# Patient Record
Sex: Male | Born: 1975 | Race: Black or African American | Hispanic: No | Smoking: Heavy tobacco smoker
Health system: Southern US, Community
[De-identification: ages and names within clinical notes are randomized; demographics above are authoritative.]

## PROBLEM LIST (undated history)

## (undated) ENCOUNTER — Emergency Department (HOSPITAL_COMMUNITY): Discharge status: Against Medical Advice | Payer: Self-pay

## (undated) DIAGNOSIS — I10 Essential (primary) hypertension: Secondary | ICD-10-CM

## (undated) DIAGNOSIS — F4310 Post-traumatic stress disorder, unspecified: Secondary | ICD-10-CM

## (undated) DIAGNOSIS — K859 Acute pancreatitis without necrosis or infection, unspecified: Secondary | ICD-10-CM

## (undated) DIAGNOSIS — F202 Catatonic schizophrenia: Secondary | ICD-10-CM

---

## 2006-10-12 HISTORY — PX: OTHER SURGICAL HISTORY: SHX169

## 2007-02-20 ENCOUNTER — Emergency Department (HOSPITAL_COMMUNITY): Admission: EM | Admit: 2007-02-20 | Discharge: 2007-02-20 | Payer: Self-pay | Admitting: Emergency Medicine

## 2010-08-01 ENCOUNTER — Emergency Department (HOSPITAL_BASED_OUTPATIENT_CLINIC_OR_DEPARTMENT_OTHER): Admission: EM | Admit: 2010-08-01 | Discharge: 2010-08-01 | Payer: Self-pay | Admitting: Emergency Medicine

## 2012-08-29 ENCOUNTER — Encounter (HOSPITAL_COMMUNITY): Payer: Self-pay | Admitting: *Deleted

## 2012-08-29 ENCOUNTER — Emergency Department (HOSPITAL_COMMUNITY)
Admission: EM | Admit: 2012-08-29 | Discharge: 2012-09-01 | Disposition: A | Discharge status: Home / Self Care | Payer: Self-pay | Attending: Emergency Medicine | Admitting: Emergency Medicine

## 2012-08-29 ENCOUNTER — Telehealth (HOSPITAL_COMMUNITY): Payer: Self-pay | Admitting: Licensed Clinical Social Worker

## 2012-08-29 DIAGNOSIS — F202 Catatonic schizophrenia: Secondary | ICD-10-CM

## 2012-08-29 DIAGNOSIS — F29 Unspecified psychosis not due to a substance or known physiological condition: Secondary | ICD-10-CM | POA: Insufficient documentation

## 2012-08-29 LAB — ETHANOL: Alcohol, Ethyl (B): <11 mg/dL (ref 0–11)

## 2012-08-29 LAB — POCT I-STAT, CHEM 8
BUN: 21 mg/dL (ref 6–23)
Chloride: 104 mEq/L (ref 96–112)
HCT: 37 % — ABNORMAL LOW (ref 39.0–52.0)
Sodium: 141 mEq/L (ref 135–145)
TCO2: 21 mmol/L (ref 0–100)

## 2012-08-29 MED ORDER — HALOPERIDOL LACTATE 5 MG/ML IJ SOLN
5.0000 mg | Freq: Once | INTRAMUSCULAR | Status: AC
  Administered 2012-08-29: 5 mg via INTRAMUSCULAR
  Filled 2012-08-29: qty 1

## 2012-08-29 MED ORDER — LORAZEPAM 1 MG PO TABS
1.0000 mg | ORAL_TABLET | Freq: Three times a day (TID) | ORAL | Status: DC | PRN
  Filled 2012-08-29 (×2): qty 1

## 2012-08-29 MED ORDER — ZOLPIDEM TARTRATE 5 MG PO TABS: 5.0000 mg | ORAL_TABLET | Freq: Every evening | ORAL | Status: DC | PRN

## 2012-08-29 MED ORDER — ONDANSETRON HCL 4 MG PO TABS: 4.0000 mg | ORAL_TABLET | Freq: Three times a day (TID) | ORAL | Status: DC | PRN

## 2012-08-29 MED ORDER — ALUM & MAG HYDROXIDE-SIMETH 200-200-20 MG/5ML PO SUSP: 30.0000 mL | ORAL | Status: DC | PRN

## 2012-08-29 MED ORDER — MIDAZOLAM HCL 2 MG/2ML IJ SOLN
5.0000 mg | Freq: Once | INTRAMUSCULAR | Status: AC
  Administered 2012-08-29: 5 mg via INTRAVENOUS
  Filled 2012-08-29: qty 6

## 2012-08-29 MED ORDER — ACETAMINOPHEN 325 MG PO TABS: 650.0000 mg | ORAL_TABLET | ORAL | Status: DC | PRN

## 2012-08-29 MED ORDER — MIDAZOLAM HCL 2 MG/2ML IJ SOLN
5.0000 mg | Freq: Once | INTRAMUSCULAR | Status: AC
  Administered 2012-08-29: 5 mg via INTRAMUSCULAR
  Filled 2012-08-29: qty 2

## 2012-08-29 NOTE — Progress Notes (Signed)
ED staff reporting pt not answering questions since ED admission ED note reports pt not taking medications No medications on MAR listed Discussed this with ACT team member who was also unable to get pt to respond to her for assessment of needs CM called and left a voice message for [place] Significant other [phone] Cm requested a return call to to ED TCU to assist with medical, social and medication hx for pt
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
## 2012-08-29 NOTE — Progress Notes (Deleted)
Regular diet per report; pt unable to provide further insight or information regarding his diet.
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
## 2012-08-29 NOTE — ED Notes (Addendum)
Patient is resting comfortably. Breathing WNL.  
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
## 2012-08-29 NOTE — ED Notes (Signed)
Patient refused blood draw for labs.RN Toniann Fail made aware

## 2012-08-29 NOTE — ED Notes (Signed)
Pt ripped off armband and quickly put in his mouth and began to chew it. He is trying to rip off his pants as well. He spit out the armband. [employer] assisted  MD notified.

## 2012-08-29 NOTE — ED Notes (Signed)
Triage started, but patient stopped answering questions when started surgical history. Will attempt to complete later.

## 2012-08-29 NOTE — ED Notes (Signed)
Pt refused labs. M.D. Notified.

## 2012-08-29 NOTE — ED Notes (Signed)
Pt noted to be having seizure like activity by Psych ED RN. Radford Pax, EDP made aware. Pt noted by this RN to be having full body tremors, continuing to through IV insertion and move to TCU, greater than . Pt responsive with eye movement and grimacing to oxygen Waldron placement and movement away from ammonia capsule. Pt has spontaneous eye opening, and pulled away from RN while starting IV. Pt moved to TCU, with IV intact.

## 2012-08-29 NOTE — Progress Notes (Signed)
Records in pt file reports pt listed as recent release from jail on 08/27/12. ED CM contact the following phamacies in [facility]-- [facility] at [phone], [facility] at [phone], [facility] at [phone] and [facility] at [phone] None of these pharmacies have pt listed as active patient receiving medications from them.  ED SW updated

## 2012-08-29 NOTE — ED Notes (Signed)
Patient is resting comfortably. 

## 2012-08-30 DIAGNOSIS — F1010 Alcohol abuse, uncomplicated: Secondary | ICD-10-CM

## 2012-08-30 DIAGNOSIS — G259 Extrapyramidal and movement disorder, unspecified: Secondary | ICD-10-CM

## 2012-08-30 DIAGNOSIS — F4310 Post-traumatic stress disorder, unspecified: Secondary | ICD-10-CM

## 2012-08-30 DIAGNOSIS — F259 Schizoaffective disorder, unspecified: Secondary | ICD-10-CM

## 2012-08-30 LAB — CBC WITH DIFFERENTIAL/PLATELET
Basophils Absolute: 0 10*3/uL (ref 0.0–0.1)
Basophils Relative: 0 % (ref 0–1)
Eosinophils Absolute: 0.1 10*3/uL (ref 0.0–0.7)
Eosinophils Relative: 1 % (ref 0–5)
HCT: 38 % — ABNORMAL LOW (ref 39.0–52.0)
Hemoglobin: 12.9 g/dL — ABNORMAL LOW (ref 13.0–17.0)
MCH: 27.6 pg (ref 26.0–34.0)
MCHC: 33.9 g/dL (ref 30.0–36.0)
Monocytes Absolute: 1.3 10*3/uL — ABNORMAL HIGH (ref 0.1–1.0)
Monocytes Relative: 15 % — ABNORMAL HIGH (ref 3–12)
Neutro Abs: 5.2 10*3/uL (ref 1.7–7.7)
RDW: 13.2 % (ref 11.5–15.5)

## 2012-08-30 LAB — COMPREHENSIVE METABOLIC PANEL
ALT: 31 U/L (ref 0–53)
AST: 63 U/L — ABNORMAL HIGH (ref 0–37)
Alkaline Phosphatase: 80 U/L (ref 39–117)
CO2: 21 mEq/L (ref 19–32)
Calcium: 9.1 mg/dL (ref 8.4–10.5)
GFR calc Af Amer: >90 mL/min (ref >90)
Glucose, Bld: 65 mg/dL — ABNORMAL LOW (ref 70–99)
Potassium: 3.9 mEq/L (ref 3.5–5.1)
Sodium: 139 mEq/L (ref 135–145)
Total Protein: 7.2 g/dL (ref 6.0–8.3)

## 2012-08-30 LAB — SALICYLATE LEVEL: Salicylate Lvl: <2 mg/dL — ABNORMAL LOW (ref 2.8–20.0)

## 2012-08-30 LAB — URINALYSIS, ROUTINE W REFLEX MICROSCOPIC
Ketones, ur: >80 mg/dL — AB
Leukocytes, UA: NEGATIVE
Nitrite: NEGATIVE
Protein, ur: NEGATIVE mg/dL

## 2012-08-30 LAB — RAPID URINE DRUG SCREEN, HOSP PERFORMED: Barbiturates: NOT DETECTED

## 2012-08-30 MED ORDER — BENZTROPINE MESYLATE 1 MG/ML IJ SOLN: 0.5000 mg | Freq: Two times a day (BID) | INTRAMUSCULAR | Status: DC

## 2012-08-30 MED ORDER — DIPHENHYDRAMINE HCL 25 MG PO CAPS
50.0000 mg | ORAL_CAPSULE | Freq: Every day | ORAL | Status: DC
  Administered 2012-08-31: 50 mg via ORAL
  Filled 2012-08-30: qty 2

## 2012-08-30 MED ORDER — HALOPERIDOL DECANOATE 100 MG/ML IM SOLN
50.0000 mg | Freq: Once | INTRAMUSCULAR | Status: AC
  Administered 2012-08-30: 50 mg via INTRAMUSCULAR
  Filled 2012-08-30: qty 0.5

## 2012-08-30 MED ORDER — BENZTROPINE MESYLATE 1 MG/ML IJ SOLN
0.5000 mg | Freq: Two times a day (BID) | INTRAMUSCULAR | Status: DC
  Administered 2012-08-30 – 2012-08-31 (×2): 0.5 mg via INTRAMUSCULAR
  Filled 2012-08-30 (×2): qty 2

## 2012-08-30 MED ORDER — AMMONIA AROMATIC IN INHA
RESPIRATORY_TRACT | Status: AC
  Administered 2012-08-30: 1 mL via NASAL
  Filled 2012-08-30: qty 10

## 2012-08-30 MED ORDER — LORAZEPAM 0.5 MG PO TABS
0.5000 mg | ORAL_TABLET | Freq: Two times a day (BID) | ORAL | Status: DC
  Administered 2012-08-31 – 2012-09-01 (×2): 0.5 mg via ORAL
  Filled 2012-08-30: qty 1

## 2012-08-30 MED ORDER — DIPHENHYDRAMINE HCL 50 MG/ML IJ SOLN
50.0000 mg | Freq: Every day | INTRAMUSCULAR | Status: DC
  Administered 2012-08-30: 50 mg via INTRAMUSCULAR
  Filled 2012-08-30: qty 1

## 2012-08-30 MED ORDER — LORAZEPAM 2 MG/ML IJ SOLN
0.5000 mg | Freq: Two times a day (BID) | INTRAMUSCULAR | Status: DC
  Administered 2012-08-30 – 2012-08-31 (×2): 0.5 mg via INTRAMUSCULAR
  Filled 2012-08-30 (×2): qty 1

## 2012-08-30 MED ORDER — BENZTROPINE MESYLATE 1 MG PO TABS: 0.5000 mg | ORAL_TABLET | Freq: Two times a day (BID) | ORAL | Status: DC

## 2012-08-30 MED ORDER — BENZTROPINE MESYLATE 1 MG PO TABS
0.5000 mg | ORAL_TABLET | Freq: Two times a day (BID) | ORAL | Status: DC
  Administered 2012-08-31 – 2012-09-01 (×2): 0.5 mg via ORAL
  Filled 2012-08-30 (×3): qty 1

## 2012-08-30 NOTE — ED Notes (Signed)
Pt with seizure like activity, post lab collect. Sheets where soiled from an episode of incontinence. Pt is responsive. Has had several tremor like activity duration of 30 -45 seconds , intermittant since 0030.  Pt follows commands, able to change linen and blue scrubs on bed.   MD aware, will assess.

## 2012-08-30 NOTE — Treatment Plan (Signed)
After collaborating with a [facility] medical service provider it was determined that pt meets exclusion criteria based on "prior and or current history of violent behavior which is beyond the capabilities of the staff and physical environment to contain and manage."

## 2012-08-30 NOTE — ED Notes (Signed)
Pt told doctor that if he got different socks it would fix his back. Pt given tan socks and now pt walking to bathroom.
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
## 2012-08-30 NOTE — ED Provider Notes (Signed)
Called to bedside because pt was shaking again, not responding to nurses.  Vitals were taken again and they are unremarkable. Earlier in the day he had been up and communicative.  He spoke to Dr Shela Commons per his note.  Pt has a persistent fine tremor throughout all extremities.  He does not respond to voice.  When I attempt to open his eyes he resists and keeps them shut.  Pt resists movement of his hands and when I hold his hand above his face he keeps puts them back in his lap.  I placed an ammonia capsule under his nose.  Pt held his breath.  When I told him he could get rid of it he then snorted and blew the ammonia capsule away.  This does not appear to be epileptic seizures and appear to be psychiatric in nature.  Pt will be monitored.  Celene Kras, MD 08/30/12 2004

## 2012-08-30 NOTE — ED Notes (Signed)
Patient is resting comfortably. Given warm blankets d/t mild tremors. No incontinence episode noted. Pt follows commands. Refused oral temperature.  Grabbed at the older blankets as the new blankets were being applied.

## 2012-08-30 NOTE — ED Notes (Signed)
Patient is resting comfortably. 

## 2012-08-30 NOTE — ED Provider Notes (Addendum)
Pt seen and evaluated this am.  Awaiting placement.  Denies any issues.  Pt was seen yesterday and last night with shaking.  This was not felt by the physicians to be a seizure and pt stopped with an ammonia capsule the first episode and the second time, he stopped when the physician asked him to stop shaking.   Rolan Bucco, MD 08/30/12 2956  Rolan Bucco, MD 08/30/12 2130  Rolan Bucco, MD 08/30/12 1606

## 2012-08-30 NOTE — Clinical Social Work Note (Signed)
CSW attempted to assess pt at bedside.  Pt requested new scrubs.  Tech made aware.  Pt refused to participate in assessment.  Pt stated, "I need to rest".  CSW proceeded with assessment- pt endorsed selective mutism. Vickii Penna, LCSWA [phone]  Clinical Social Work

## 2012-08-30 NOTE — ED Notes (Signed)
Patient noted to have seizure like activity and Dr. Lynelle Doctor informed and at bedside. Ammonia Capsule use patient responded to that. Will continue to watch patient.

## 2012-08-30 NOTE — ED Notes (Signed)
Pt was offered the choice of PO meds or IM. When asked if pt would swallow pills pt shook head no.

## 2012-08-31 DIAGNOSIS — Z91199 Patient's noncompliance with other medical treatment and regimen due to unspecified reason: Secondary | ICD-10-CM

## 2012-08-31 DIAGNOSIS — Z9119 Patient's noncompliance with other medical treatment and regimen: Secondary | ICD-10-CM

## 2012-08-31 NOTE — Progress Notes (Signed)
[facility] ED CM reviewed EPIC notes since 08/29/12 and noted temp 99.1 oral on 08/31/12 other Vital signs stable. Drug screen positive for benzoiazepines UA spec gravity 1.034 bil moderate >80 ketones blood glucose 65 AST 63 hgb 12.9 hct 38 (but with increase since 08/29/12 was hgb 12.6 hct 37) mono pct 15 mono abs 1.3.  Started on "Cogentin 1 mg PO/IM twice a day, Ativan 0.5mg  PO/IM BID and Benadryl 50 mg Qhs. Gave Haldol Dec 50 mg /0.5 ml and may use Ativan 1 mg PO every six hours / PRN" . Pending medical records from [facility]. Inpatient treatment for further crisis stabilization requested but denied. Re evaluated by ED psychiatrist d/c plans: Continue his current medication management (encouraged po intake of medicines) and "may go home if shows, continuous improvement in his mental status. Patient may need to be hospitalized, who he feels regrets and meanwhile."

## 2012-08-31 NOTE — ED Notes (Signed)
Per Dr. Shela Commons, pt needs to be compliant with request of taking medications PO, eating, and being active in order to be discharged.

## 2012-08-31 NOTE — Progress Notes (Signed)
[facility] ED CM reviewed EPIC notes since
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
## 2012-08-31 NOTE — BHH Counselor (Signed)
Confirmed with [facility] Assessment pt has been declined due to prior and/or current history of violent behavior which is beyond the capabilities of the staff and physical environment to contain and manage. Will seek alternative placement.

## 2012-08-31 NOTE — Consult Note (Signed)
Reason for Consult: psychosis, aggression, non compliance and catatonia Referring Physician: Dr. Rosalio Loud [name] is an 36 y.o. male.  HPI: patient was seen for the follow up psychiatric evaluation. Patient was appeared lying down in his bed supine position Awake, Alert, open his eyes and responded verbally. Patient was calm cooperative, and denied symptoms of depression, anxiety, and psychosis. Patient denied suicidal, homicidal ideation. Patient asked if he can go home. Patient was encouraged to stay out of the bed, use the restroom, take a shower, eat his food and drink fluids. Patient also encouraged rested take his medication by mouth instead of injectable. Patient was taken his Haldol decanoate 50 mg shot yesterday and also received Cogentin and Ativan IM this morning. Patient has been in agreement with recommendations and cooperative with it. If patient continued to improve mental status, might be safe to go home in a day or 2 days. Patient does not have any seizure activity.  MSE: Patient was awake, alert, oriented, has less catatonic symptoms, more verbal. Patient stated, gives a short answers does not elaborate and quickly says don't know, or know I do not remember. It is difficult to find out, his baseline from this. Patient has no irritable, agitated, and aggressive. Patient does not required any restraints. Patient does not appear to be responding to the internal stimuli. Patient has a poor insight, judgment and impulse control is.  History reviewed. No pertinent past medical history.  No past surgical history on file.  No family history on file.  Social History:  does not have a smoking history on file. He does not have any smokeless tobacco history on file. His alcohol and drug histories not on file.  Allergies: No Known Allergies  Medications: I have reviewed the patient's current medications.  Results for orders placed during the hospital encounter of 08/29/12 (from the past 48  hour(s))  COMPREHENSIVE METABOLIC PANEL     Status: Abnormal   Collection Time   08/30/12 12:25 AM      Component Value Range Comment   Sodium 139  135 - 145 mEq/L    Potassium 3.9  3.5 - 5.1 mEq/L    Chloride 98  96 - 112 mEq/L    CO2 21  19 - 32 mEq/L    Glucose, Bld 65 (*) 70 - 99 mg/dL    BUN 18  6 - 23 mg/dL    Creatinine, Ser 1.61  0.50 - 1.35 mg/dL    Calcium 9.1  8.4 - 09.6 mg/dL    Total Protein 7.2  6.0 - 8.3 g/dL    Albumin 3.9  3.5 - 5.2 g/dL    AST 63 (*) 0 - 37 U/L    ALT 31  0 - 53 U/L    Alkaline Phosphatase 80  39 - 117 U/L    Total Bilirubin 0.3  0.3 - 1.2 mg/dL    GFR calc non Af Amer >90  >90 mL/min    GFR calc Af Amer >90  >90 mL/min   CBC WITH DIFFERENTIAL     Status: Abnormal   Collection Time   08/30/12 12:25 AM      Component Value Range Comment   WBC 8.6  4.0 - 10.5 K/uL    RBC 4.67  4.22 - 5.81 MIL/uL    Hemoglobin 12.9 (*) 13.0 - 17.0 g/dL    HCT 04.5 (*) 40.9 - 52.0 %    MCV 81.4  78.0 - 100.0 fL    MCH 27.6  26.0 - 34.0 pg    MCHC 33.9  30.0 - 36.0 g/dL    RDW 16.1  09.6 - 04.5 %    Platelets 150  150 - 400 K/uL    Neutrophils Relative 61  43 - 77 %    Neutro Abs 5.2  1.7 - 7.7 K/uL    Lymphocytes Relative 23  12 - 46 %    Lymphs Abs 2.0  0.7 - 4.0 K/uL    Monocytes Relative 15 (*) 3 - 12 %    Monocytes Absolute 1.3 (*) 0.1 - 1.0 K/uL    Eosinophils Relative 1  0 - 5 %    Eosinophils Absolute 0.1  0.0 - 0.7 K/uL    Basophils Relative 0  0 - 1 %    Basophils Absolute 0.0  0.0 - 0.1 K/uL   AMMONIA     Status: Normal   Collection Time   08/30/12 12:25 AM      Component Value Range Comment   Ammonia 25  11 - 60 umol/L   ACETAMINOPHEN LEVEL     Status: Normal   Collection Time   08/30/12 12:25 AM      Component Value Range Comment   Acetaminophen (Tylenol), Serum <15.0  10 - 30 ug/mL   SALICYLATE LEVEL     Status: Abnormal   Collection Time   08/30/12 12:25 AM      Component Value Range Comment   Salicylate Lvl <2.0 (*) 2.8 - 20.0  mg/dL   URINALYSIS, ROUTINE W REFLEX MICROSCOPIC     Status: Abnormal   Collection Time   08/30/12  7:46 AM      Component Value Range Comment   Color, Urine YELLOW  YELLOW    APPearance CLEAR  CLEAR    Specific Gravity, Urine 1.034 (*) 1.005 - 1.030    pH 6.0  5.0 - 8.0    Glucose, UA NEGATIVE  NEGATIVE mg/dL    Hgb urine dipstick NEGATIVE  NEGATIVE    Bilirubin Urine MODERATE (*) NEGATIVE    Ketones, ur >80 (*) NEGATIVE mg/dL    Protein, ur NEGATIVE  NEGATIVE mg/dL    Urobilinogen, UA 1.0  0.0 - 1.0 mg/dL    Nitrite NEGATIVE  NEGATIVE    Leukocytes, UA NEGATIVE  NEGATIVE MICROSCOPIC NOT DONE ON URINES WITH NEGATIVE PROTEIN, BLOOD, LEUKOCYTES, NITRITE, OR GLUCOSE <1000 mg/dL.  URINE RAPID DRUG SCREEN (HOSP PERFORMED)     Status: Abnormal   Collection Time   08/30/12  7:46 AM      Component Value Range Comment   Opiates NONE DETECTED  NONE DETECTED    Cocaine NONE DETECTED  NONE DETECTED    Benzodiazepines POSITIVE (*) NONE DETECTED    Amphetamines NONE DETECTED  NONE DETECTED    Tetrahydrocannabinol NONE DETECTED  NONE DETECTED    Barbiturates NONE DETECTED  NONE DETECTED     No results found.  Positive for aggressive behavior, bipolar and Psychosis, auditory hallucinations, not specified Blood pressure 116/78, pulse 82, temperature 99.1 F (37.3 C), temperature source Oral, resp. rate 19, SpO2 98.00%.   Assessment/Plan: Schizoaffective disorder Posttraumatic stress disorder  Noncompliance with medication   Patient will continue his current medication management and may go home if shows, continuous improvement in his mental status. Patient may need to be hospitalized, who he feels regrets and meanwhile. Please encourage medication by mouth.  Richard [name],Richard R. 08/31/2012, 5:51 PM

## 2012-08-31 NOTE — BHH Counselor (Signed)
Info sent to [facility] and [facility] for possible placement

## 2012-09-01 ENCOUNTER — Telehealth (HOSPITAL_COMMUNITY): Payer: Self-pay | Admitting: Licensed Clinical Social Worker

## 2012-09-01 DIAGNOSIS — F202 Catatonic schizophrenia: Secondary | ICD-10-CM

## 2012-09-01 MED ORDER — BENZTROPINE MESYLATE 0.5 MG PO TABS: 0.5000 mg | ORAL_TABLET | Freq: Two times a day (BID) | ORAL | Status: AC

## 2012-09-01 MED ORDER — DIPHENHYDRAMINE HCL 50 MG PO CAPS: 50.0000 mg | ORAL_CAPSULE | Freq: Every day | ORAL | Status: AC

## 2012-09-01 NOTE — ED Provider Notes (Signed)
PT is on wait list at [facility] previously by Dr.J and recommends inpt.  Currently sleeping.    Filed Vitals:   09/01/12 0617  BP: 99/61  Pulse: 69  Temp: 98.3 F (36.8 C)  Resp: 18     Richard [name]. Oletta Lamas, MD 09/01/12 [phone]

## 2012-09-01 NOTE — ED Notes (Signed)
Dr. Jonalagada at bedside.  
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
## 2012-09-01 NOTE — ED Notes (Signed)
Dr. Elsie Saas and Jessie Foot with act team aware of IVC papers, per [facility], will take care of it.

## 2012-09-01 NOTE — Consult Note (Signed)
Reason for Consult: Schizophrenic Catatonia Referring Physician: Dr. Veverly Fells Richard [name] is an 36 y.o. male.  HPI: Patient was seen and chart reviewed. Patient has been improved his mental status during last 24 hours with his current treatment and participated in his ADL's and able to eat his food without difficulty and drink his fluids. He came out side to nursing station and requested regarding his needs and able to take shower without assistance. He does not required restraints, additional IM medications and compliant with prescribed medication which swallowed by mouth. He denied symptoms of depression, anxiety, paranoia, delusions and hallucinations.  MSE: patient was calm and cooperative. He was found in his room and shower active, energetic, able to perform all basic activities. He has normal psychomotor activity. He has no signs and symptoms of catatonia at this time. He has good mood and bright and full affect. He has normal speech and thought process. He has denied SI/HI and no evidence of psychosis.  History reviewed. No pertinent past medical history.  No past surgical history on file.  No family history on file.  Social History:  does not have a smoking history on file. He does not have any smokeless tobacco history on file. His alcohol and drug histories not on file.  Allergies: No Known Allergies  Medications: I have reviewed the patient's current medications.  No results found for this or any previous visit (from the past 48 hour(s)).  No results found.  Positive for bipolar, depression, sleep disturbance and catatonia and aggression prior to admission. Blood pressure 109/71, pulse 72, temperature 98.3 F (36.8 C), temperature source Oral, resp. rate 18, SpO2 99.00%.   Assessment/Plan: Paranoid schizophrenia with Catatonia  Patient has significant improvement with his treatment, not required acute psychiatric hospitalization and referred to out patient psychiatric  services at [facility] behavioral health in [facility]. He was discharged to home with five days supply of scripts for Benadryl and cogentin. ACT therapist will contact his GF and to inform about his improvement and discharge. Patient is hoping his GF will come to pick him up.  Richard [name],Richard R. 09/01/2012, 3:32 PM

## 2012-09-01 NOTE — ED Notes (Signed)
Pt to be discharged home per Dr. Elsie Saas after Jessie Foot with act team speaks with girlfriend and finishes paperwork, Jessie Foot requested that this nurse await until her portion is finished before discharging pt home, will monitor.

## 2014-12-25 ENCOUNTER — Inpatient Hospital Stay (HOSPITAL_COMMUNITY)
Admission: EM | Admit: 2014-12-25 | Discharge: 2014-12-28 | DRG: 439 | Disposition: A | Discharge status: Home / Self Care | Payer: Self-pay | Attending: Family Medicine | Admitting: Family Medicine

## 2014-12-25 ENCOUNTER — Encounter (HOSPITAL_COMMUNITY): Payer: Self-pay | Admitting: Emergency Medicine

## 2014-12-25 DIAGNOSIS — F202 Catatonic schizophrenia: Secondary | ICD-10-CM | POA: Diagnosis present

## 2014-12-25 DIAGNOSIS — F17213 Nicotine dependence, cigarettes, with withdrawal: Secondary | ICD-10-CM

## 2014-12-25 DIAGNOSIS — K859 Acute pancreatitis without necrosis or infection, unspecified: Secondary | ICD-10-CM | POA: Diagnosis present

## 2014-12-25 DIAGNOSIS — B1910 Unspecified viral hepatitis B without hepatic coma: Secondary | ICD-10-CM

## 2014-12-25 DIAGNOSIS — D696 Thrombocytopenia, unspecified: Secondary | ICD-10-CM | POA: Diagnosis present

## 2014-12-25 DIAGNOSIS — I10 Essential (primary) hypertension: Secondary | ICD-10-CM

## 2014-12-25 DIAGNOSIS — K858 Other acute pancreatitis: Secondary | ICD-10-CM

## 2014-12-25 HISTORY — DX: Catatonic schizophrenia: F20.2

## 2014-12-25 HISTORY — DX: Acute pancreatitis without necrosis or infection, unspecified: K85.90

## 2014-12-25 HISTORY — DX: Post-traumatic stress disorder, unspecified: F43.10

## 2014-12-25 HISTORY — DX: Essential (primary) hypertension: I10

## 2014-12-25 LAB — URINALYSIS, ROUTINE W REFLEX MICROSCOPIC
GLUCOSE, UA: NEGATIVE mg/dL
KETONES UR: 15 mg/dL — AB
LEUKOCYTES UA: NEGATIVE
NITRITE: NEGATIVE
Specific Gravity, Urine: 1.042 — ABNORMAL HIGH (ref 1.005–1.030)
UROBILINOGEN UA: 0.2 mg/dL (ref 0.0–1.0)
pH: 5.5 (ref 5.0–8.0)

## 2014-12-25 LAB — CBC WITH DIFFERENTIAL/PLATELET
BASOS PCT: 0 % (ref 0–1)
Basophils Absolute: 0 10*3/uL (ref 0.0–0.1)
Eosinophils Absolute: 0 10*3/uL (ref 0.0–0.7)
Eosinophils Relative: 0 % (ref 0–5)
HEMATOCRIT: 48.4 % (ref 39.0–52.0)
HEMOGLOBIN: 17.1 g/dL — AB (ref 13.0–17.0)
Lymphocytes Relative: 11 % — ABNORMAL LOW (ref 12–46)
Lymphs Abs: 1.3 10*3/uL (ref 0.7–4.0)
MCH: 28.8 pg (ref 26.0–34.0)
MCHC: 35.3 g/dL (ref 30.0–36.0)
MCV: 81.6 fL (ref 78.0–100.0)
MONO ABS: 1.3 10*3/uL — AB (ref 0.1–1.0)
MONOS PCT: 11 % (ref 3–12)
NEUTROS ABS: 9.2 10*3/uL — AB (ref 1.7–7.7)
Neutrophils Relative %: 78 % — ABNORMAL HIGH (ref 43–77)
Platelets: 173 10*3/uL (ref 150–400)
RBC: 5.93 MIL/uL — ABNORMAL HIGH (ref 4.22–5.81)
RDW: 12.5 % (ref 11.5–15.5)
WBC: 11.8 10*3/uL — ABNORMAL HIGH (ref 4.0–10.5)

## 2014-12-25 LAB — COMPREHENSIVE METABOLIC PANEL
ALBUMIN: 4.6 g/dL (ref 3.5–5.2)
ALK PHOS: 81 U/L (ref 39–117)
ALT: 33 U/L (ref 0–53)
AST: 32 U/L (ref 0–37)
Anion gap: 10 (ref 5–15)
BUN: 10 mg/dL (ref 6–23)
CALCIUM: 9.5 mg/dL (ref 8.4–10.5)
CO2: 27 mmol/L (ref 19–32)
Chloride: 100 mmol/L (ref 96–112)
Creatinine, Ser: 1.23 mg/dL (ref 0.50–1.35)
GFR calc Af Amer: 85 mL/min — ABNORMAL LOW (ref >90)
GFR calc non Af Amer: 74 mL/min — ABNORMAL LOW (ref >90)
Glucose, Bld: 147 mg/dL — ABNORMAL HIGH (ref 70–99)
POTASSIUM: 4.1 mmol/L (ref 3.5–5.1)
SODIUM: 137 mmol/L (ref 135–145)
TOTAL PROTEIN: 8.8 g/dL — AB (ref 6.0–8.3)
Total Bilirubin: 1 mg/dL (ref 0.3–1.2)

## 2014-12-25 LAB — LIPASE, BLOOD: Lipase: 1446 U/L — ABNORMAL HIGH (ref 11–59)

## 2014-12-25 LAB — URINE MICROSCOPIC-ADD ON

## 2014-12-25 LAB — LACTATE DEHYDROGENASE: LDH: 190 U/L (ref 94–250)

## 2014-12-25 MED ORDER — AMLODIPINE BESYLATE 5 MG PO TABS
5.0000 mg | ORAL_TABLET | Freq: Every day | ORAL | Status: DC
  Administered 2014-12-25 – 2014-12-28 (×4): 5 mg via ORAL
  Filled 2014-12-25 (×4): qty 1

## 2014-12-25 MED ORDER — ENOXAPARIN SODIUM 40 MG/0.4ML ~~LOC~~ SOLN
40.0000 mg | SUBCUTANEOUS | Status: DC
  Administered 2014-12-25 – 2014-12-27 (×3): 40 mg via SUBCUTANEOUS
  Filled 2014-12-25 (×3): qty 0.4

## 2014-12-25 MED ORDER — SODIUM CHLORIDE 0.9 % IV BOLUS (SEPSIS)
1000.0000 mL | Freq: Once | INTRAVENOUS | Status: AC
  Administered 2014-12-25: 1000 mL via INTRAVENOUS

## 2014-12-25 MED ORDER — HYDROMORPHONE HCL 1 MG/ML IJ SOLN
1.0000 mg | Freq: Once | INTRAMUSCULAR | Status: AC
  Administered 2014-12-25: 1 mg via INTRAVENOUS
  Filled 2014-12-25: qty 1

## 2014-12-25 MED ORDER — ONDANSETRON HCL 4 MG PO TABS
4.0000 mg | ORAL_TABLET | Freq: Four times a day (QID) | ORAL | Status: DC | PRN
  Administered 2014-12-27: 4 mg via ORAL
  Filled 2014-12-25: qty 1

## 2014-12-25 MED ORDER — ONDANSETRON HCL 4 MG/2ML IJ SOLN: 4.0000 mg | Freq: Four times a day (QID) | INTRAMUSCULAR | Status: DC | PRN

## 2014-12-25 MED ORDER — SODIUM CHLORIDE 0.9 % IV SOLN
INTRAVENOUS | Status: DC
  Administered 2014-12-25: 12:00:00 via INTRAVENOUS
  Administered 2014-12-25: 1000 mL via INTRAVENOUS
  Administered 2014-12-26: via INTRAVENOUS
  Administered 2014-12-26: 1000 mL via INTRAVENOUS
  Administered 2014-12-26 (×2): via INTRAVENOUS
  Administered 2014-12-27: 150 mL/h via INTRAVENOUS
  Administered 2014-12-27: 14:00:00 via INTRAVENOUS

## 2014-12-25 MED ORDER — HYDROMORPHONE HCL 1 MG/ML IJ SOLN
0.5000 mg | INTRAMUSCULAR | Status: DC | PRN
  Administered 2014-12-25 – 2014-12-27 (×9): 0.5 mg via INTRAVENOUS
  Filled 2014-12-25 (×9): qty 1

## 2014-12-25 MED ORDER — FLEET ENEMA 7-19 GM/118ML RE ENEM: 1.0000 | ENEMA | Freq: Every day | RECTAL | Status: DC | PRN

## 2014-12-25 MED ORDER — ONDANSETRON HCL 4 MG/2ML IJ SOLN
4.0000 mg | Freq: Once | INTRAMUSCULAR | Status: AC
Start: 2014-12-25 — End: 2014-12-25
  Administered 2014-12-25: 4 mg via INTRAVENOUS
  Filled 2014-12-25: qty 2

## 2014-12-25 MED ORDER — SODIUM CHLORIDE 0.9 % IV SOLN
INTRAVENOUS | Status: AC
  Administered 2014-12-25: 09:00:00 via INTRAVENOUS

## 2014-12-25 MED ORDER — NICOTINE 21 MG/24HR TD PT24
21.0000 mg | MEDICATED_PATCH | Freq: Every day | TRANSDERMAL | Status: DC
  Administered 2014-12-25 – 2014-12-28 (×4): 21 mg via TRANSDERMAL
  Filled 2014-12-25 (×4): qty 1

## 2014-12-25 NOTE — ED Notes (Signed)
Bed: WLPT2 Expected date:  Expected time:  Means of arrival:  Comments: EMS 39yo

## 2014-12-25 NOTE — ED Notes (Signed)
Pt states that he has had abdominal pain and N/V x 2 days. Denies diarrhea. Alert and oriented.

## 2014-12-25 NOTE — H&P (Signed)
[facility] History and Physical  Arlys JohnJames M Lesch ZOX:[id number] DOB: 12/09/1975 DOA: 12/25/2014  Referring physician:  Dutch Quinthris Pollina PCP:  No primary care provider on file.   Chief Complaint:  Abdominal pain, nausea, vomiting  HPI:  The patient is a 39 y.o. year-old male with history of pancreatitis, schizophrenia catatonia who presents with abdominal pain, nausea, and vomiting.  The patient was hospitalized at [facility] in December 2015 with acute pancreatitis.  His HCTZ was stopped and he reportedly had imaging of his biliary tree/pancreas, but did not require endoscopy for follow up.  He was discharged home on oxycodone and omeprazole.  He had two alcoholic drinks 5 days prior to admission.  Two days later, he developed nausea, vomiting, and diffuse abdominal pain which has worsened to 10/10 pain.  He tried over the counter liquid "stomach upset" medication which did not help.  He was unable to eat and drink much and felt he was becoming dehydrated.  He states he has not had diarrhea and his last BM was 5 days ago.  He denies fevers, chills, dysuria.  Symptoms are similar to his previous pancreatitis.  In the ER, VS notable for temperature 99.32F, BP mildly elevated.  WBC 11.8, hemoglobin 17.1, glucose 147. LFTs wnl, but lipase 1446.    Review of Systems:  General:  Denies fevers, chills, weight loss or gain HEENT:  Denies changes to hearing and vision, rhinorrhea, sinus congestion, sore throat CV:  Denies chest pain and palpitations, lower extremity edema.  PULM:  Denies SOB, wheezing, cough.   GI:  Per HPI.   GU:  Denies dysuria, frequency, urgency ENDO:  Denies polyuria, polydipsia.   HEME:  Denies hematemesis, blood in stools, melena, abnormal bruising or bleeding.  LYMPH:  Denies lymphadenopathy.   MSK:  Denies arthralgias, myalgias.   DERM:  Denies skin rash or ulcer.   NEURO:  Denies focal numbness, weakness, slurred speech, confusion, facial droop.  PSYCH:   Denies anxiety and depression.    Past Medical History  Diagnosis Date  . Hypertension   . PTSD (post-traumatic stress disorder)   . Pancreatitis   . Catatonia schizophrenia    Past Surgical History  Procedure Laterality Date  . Liver biospy  2008   Social History:  reports that he has been smoking.  He does not have any smokeless tobacco history on file. He reports that he drinks about 1.2 oz of alcohol per week. He reports that he does not use illicit drugs.  No Known Allergies  Family History  Problem Relation Age of Onset  . Pancreatitis Neg Hx      Prior to Admission medications   Medication Sig Start Date End Date Taking? Authorizing Provider  haloperidol decanoate (HALDOL DECANOATE) 50 MG/ML injection Inject 50 mg into the muscle every 28 (twenty-eight) days.   Yes Historical Provider, MD  lisinopril (PRINIVIL,ZESTRIL) 20 MG tablet Take 20 mg by mouth daily.   Yes Historical Provider, MD  benztropine (COGENTIN) 0.5 MG tablet Take 1 tablet (0.5 mg total) by mouth 2 (two) times daily. Patient not taking: Reported on 12/25/2014 09/01/12   Leata MouseJanardhana Jonnalagadda, MD  diphenhydrAMINE (BENADRYL) 50 MG capsule Take 1 capsule (50 mg total) by mouth at bedtime. Patient not taking: Reported on 12/25/2014 09/01/12   Leata MouseJanardhana Jonnalagadda, MD   Physical Exam: Filed Vitals:   12/25/14 0733 12/25/14 0926 12/25/14 0946 12/25/14 0950  BP: 145/89 145/78 142/83   Pulse: 82 78 77   Temp: 99.7 F (37.6  C)  98.7 F (37.1 C)   TempSrc: Oral  Oral   Resp: Height:    6' (1.829 m)  Weight:    76.204 kg (168 lb)  SpO2: 96% 98% 95%      General:  Adult male, NAD  Eyes:  PERRL, anicteric, non-injected.  ENT:  Nares mildly congested.  OP clear, non-erythematous without plaques or exudates.  MMM.  Neck:  Supple without TM or JVD.    Lymph:  No cervical, supraclavicular, or submandibular LAD.  Cardiovascular:  RRR, normal S1, S2, without m/r/g.  2+ pulses, warm  extremities  Respiratory:  CTA bilaterally without increased WOB.  Abdomen:  NABS.  Soft, ND, diffusely TTP, worse in bilateral upper quadrants/epigastrium, and periumbilical area.     Skin:  No rashes or focal lesions.  Musculoskeletal:  Normal bulk and tone.  No LE edema.  Psychiatric:  A & O x 4.  Appropriate affect.  Neurologic:  CN 3-12 intact.  5/5 strength.  Sensation intact.  Labs on Admission:  Basic Metabolic Panel:  Recent Labs Lab 12/25/14 0316  NA 137  K 4.1  CL 100  CO2 27  GLUCOSE 147*  BUN 10  CREATININE 1.23  CALCIUM 9.5   Liver Function Tests:  Recent Labs Lab 12/25/14 0316  AST 32  ALT 33  ALKPHOS 81  BILITOT 1.0  PROT 8.8*  ALBUMIN 4.6    Recent Labs Lab 12/25/14 0316  LIPASE 1446*   No results for input(s): AMMONIA in the last 168 hours. CBC:  Recent Labs Lab 12/25/14 0316  WBC 11.8*  NEUTROABS 9.2*  HGB 17.1*  HCT 48.4  MCV 81.6  PLT 173   Cardiac Enzymes: No results for input(s): CKTOTAL, CKMB, CKMBINDEX, TROPONINI in the last 168 hours.  BNP (last 3 results) No results for input(s): BNP in the last 8760 hours.  ProBNP (last 3 results) No results for input(s): PROBNP in the last 8760 hours.  CBG: No results for input(s): GLUCAP in the last 168 hours.  Radiological Exams on Admission: No results found.  EKG: pending  Assessment/Plan Principal Problem:   Pancreatitis Active Problems:   Schizophrenic catatonia   Essential hypertension   Hepatitis B   Cigarette nicotine dependence with withdrawal  ---  Acute pancreatitis, Ranson score 0-1 (LDH pending).  Does not meet SIRS criteria.   -  Records have been requested from [facility] to see what work up has been completed so far.  Sounds like they may have attributed his pancreatitis to HCTZ, however, despite stopping this medication, he has had a recurrence.  Denies heavy or frequent binge alcohol use -  Alcohol cessation -  D/c lisinopril -  NS bolus x 2  followed by aggressive IVF -  CLD -  Dilaudid IV prn -  zofran prn -  ECG to check QTc  HTN -  Start norvasc  Hepatitis B, stable, followed by GI -  Liver biopsy last done in 2008  Schizophrenic catatonia, stable -  Receives monthly haldol injections -  Confirm QTc  Smoking -  Counseled cessation -  Nicotine patch   Diet:  CLD Access:  PIV IVF:  yes Proph:  lovenox  Code Status: Full Family Communication: patient alone Disposition Plan: Admit to med-surg  Time spent: 60 min Srinivas Lippman [facility] Pager [phone]  If 7PM-7AM, please contact night-coverage [email or web address] Password [facility] 12/25/2014, 10:37 AM

## 2014-12-25 NOTE — Progress Notes (Signed)
Education material provided to patient on Acute Pancreatitis as well as smoking cessation

## 2014-12-25 NOTE — ED Provider Notes (Signed)
CSN: [id number]     Arrival date & time 12/25/14  0216 History   First MD Initiated Contact with Patient 12/25/14 [phone]     Chief Complaint  Patient presents with  . Emesis  . Abdominal Pain     (Consider location/radiation/quality/duration/timing/severity/associated sxs/prior Treatment) HPI Comments: Patient presents to the ER for evaluation of abdominal pain. Patient reports that symptoms began 2 days ago. He reports diffuse abdominal pain with nausea and vomiting. Pain is severe and constant. He has not identified any exacerbating or alleviating factors. Patient reports that he had similar symptoms in December and was admitted to [facility] with pancreatitis. He is not sure what caused the pancreatitis at that time. He denies alcohol use.  Patient is a 39 y.o. male presenting with vomiting and abdominal pain.  Emesis Associated symptoms: abdominal pain   Abdominal Pain Associated symptoms: nausea and vomiting     Past Medical History  Diagnosis Date  . Hypertension   . PTSD (post-traumatic stress disorder)   . Pancreatitis    No past surgical history on file. No family history on file. History  Substance Use Topics  . Smoking status: Not on file  . Smokeless tobacco: Not on file  . Alcohol Use: Not on file    Review of Systems  Gastrointestinal: Positive for nausea, vomiting and abdominal pain.  All other systems reviewed and are negative.     Allergies  Review of patient's allergies indicates no known allergies.  Home Medications   Prior to Admission medications   Medication Sig Start Date End Date Taking? Authorizing Provider  haloperidol decanoate (HALDOL DECANOATE) 50 MG/ML injection Inject 50 mg into the muscle every 28 (twenty-eight) days.   Yes Historical Provider, MD  lisinopril (PRINIVIL,ZESTRIL) 20 MG tablet Take 20 mg by mouth daily.   Yes Historical Provider, MD  benztropine (COGENTIN) 0.5 MG tablet Take 1 tablet (0.5 mg total) by mouth 2  (two) times daily. Patient not taking: Reported on 12/25/2014 09/01/12   Leata Mouse, MD  diphenhydrAMINE (BENADRYL) 50 MG capsule Take 1 capsule (50 mg total) by mouth at bedtime. Patient not taking: Reported on 12/25/2014 09/01/12   Leata Mouse, MD   BP 145/89 mmHg  Pulse 87  Temp(Src) 98.5 F (36.9 C) (Oral)  Resp 18  SpO2 98% Physical Exam  Constitutional: He is oriented to person, place, and time. He appears well-developed and well-nourished. No distress.  HENT:  Head: Normocephalic and atraumatic.  Right Ear: Hearing normal.  Left Ear: Hearing normal.  Nose: Nose normal.  Mouth/Throat: Oropharynx is clear and moist and mucous membranes are normal.  Eyes: Conjunctivae and EOM are normal. Pupils are equal, round, and reactive to light.  Neck: Normal range of motion. Neck supple.  Cardiovascular: Regular rhythm, S1 normal and S2 normal.  Exam reveals no gallop and no friction rub.   No murmur heard. Pulmonary/Chest: Effort normal and breath sounds normal. No respiratory distress. He exhibits no tenderness.  Abdominal: Soft. Normal appearance and bowel sounds are normal. There is no hepatosplenomegaly. There is tenderness. There is no rebound, no guarding, no tenderness at McBurney's point and negative Murphy's sign. No hernia.  Musculoskeletal: Normal range of motion.  Neurological: He is alert and oriented to person, place, and time. He has normal strength. No cranial nerve deficit or sensory deficit. Coordination normal. GCS eye subscore is 4. GCS verbal subscore is 5. GCS motor subscore is 6.  Skin: Skin is warm, dry and intact. No rash noted. No  cyanosis.  Psychiatric: He has a normal mood and affect. His speech is normal and behavior is normal. Thought content normal.  Nursing note and vitals reviewed.   ED Course  Procedures (including critical care time) Labs Review Labs Reviewed  CBC WITH DIFFERENTIAL/PLATELET - Abnormal; Notable for the following:     WBC 11.8 (*)    RBC 5.93 (*)    Hemoglobin 17.1 (*)    Neutrophils Relative % 78 (*)    Neutro Abs 9.2 (*)    Lymphocytes Relative 11 (*)    Monocytes Absolute 1.3 (*)    All other components within normal limits  COMPREHENSIVE METABOLIC PANEL - Abnormal; Notable for the following:    Glucose, Bld 147 (*)    Total Protein 8.8 (*)    GFR calc non Af Amer 74 (*)    GFR calc Af Amer 85 (*)    All other components within normal limits  LIPASE, BLOOD - Abnormal; Notable for the following:    Lipase 1446 (*)    All other components within normal limits  URINALYSIS, ROUTINE W REFLEX MICROSCOPIC    Imaging Review No results found.   EKG Interpretation None      MDM   Final diagnoses:  Acute pancreatitis, unspecified pancreatitis type   Patient presents to the ER for evaluation of abdominal pain, nausea, vomiting. Patient has a history of pancreatitis. Symptoms are similar today. Patient has diffuse abdominal tenderness without signs of peritonitis. Vital signs are stable except for mild hypertension. No fever. Lipase 1446. Patient has had previous pancreatitis, does not require any imaging at this time because his exam is fairly benign. Will obtain records from [facility]. Admit patient for fluids and pain control.     Gilda Creasehristopher J Teckla Christiansen, MD 12/25/14 [phone]

## 2014-12-25 NOTE — Care Management Note (Signed)
    Page 1 of 1   12/25/2014     11:49:55 AM CARE MANAGEMENT NOTE 12/25/2014  Patient:  Arlys JohnMAYEN,Kharee M   Account Number:  [id number]  Date Initiated:  12/25/2014  Documentation initiated by:  Lorenda IshiharaPEELE,Nigel Wessman  Subjective/Objective Assessment:   39 yo male admitted with pancreatitis. PTA lived at home.     Action/Plan:   Home when stable   Anticipated DC Date:  12/28/2014   Anticipated DC Plan:  HOME/SELF CARE      DC Planning Services  CM consult  PCP issues  Medication Assistance      Choice offered to / List presented to:             Status of service:  Completed, signed off Medicare Important Message given?   (If response is "NO", the following Medicare IM given date fields will be blank) Date Medicare IM given:   Medicare IM given by:   Date Additional Medicare IM given:   Additional Medicare IM given by:    Discharge Disposition:  HOME/SELF CARE  Per UR Regulation:  Reviewed for med. necessity/level of care/duration of stay  If discussed at Long Length of Stay Meetings, dates discussed:    Comments:  12-25-14 Lorenda IshiharaSuzanne Sanford Lindblad RN CM 1130 Spoke with patient at bedside. States PCP [place]. [place]o [place]. Has seen this PCP within the last year and wishes to continue to see her. States get medication from here as well. Will continue to follow for d/c needs.

## 2014-12-25 NOTE — Progress Notes (Signed)
Called for patient report from ED.  Nurse to call back with report

## 2014-12-26 DIAGNOSIS — K859 Acute pancreatitis, unspecified: Principal | ICD-10-CM

## 2014-12-26 DIAGNOSIS — B181 Chronic viral hepatitis B without delta-agent: Secondary | ICD-10-CM

## 2014-12-26 LAB — CBC
HCT: 40.6 % (ref 39.0–52.0)
Hemoglobin: 13.6 g/dL (ref 13.0–17.0)
MCH: 27.7 pg (ref 26.0–34.0)
MCHC: 33.5 g/dL (ref 30.0–36.0)
MCV: 82.7 fL (ref 78.0–100.0)
PLATELETS: 108 10*3/uL — AB (ref 150–400)
RBC: 4.91 MIL/uL (ref 4.22–5.81)
RDW: 12.9 % (ref 11.5–15.5)
WBC: 7.5 10*3/uL (ref 4.0–10.5)

## 2014-12-26 LAB — COMPREHENSIVE METABOLIC PANEL
ALBUMIN: 3.1 g/dL — AB (ref 3.5–5.2)
ALK PHOS: 52 U/L (ref 39–117)
ALT: 18 U/L (ref 0–53)
AST: 17 U/L (ref 0–37)
Anion gap: 10 (ref 5–15)
BUN: 6 mg/dL (ref 6–23)
CALCIUM: 8.1 mg/dL — AB (ref 8.4–10.5)
CO2: 26 mmol/L (ref 19–32)
Chloride: 104 mmol/L (ref 96–112)
Creatinine, Ser: 1.09 mg/dL (ref 0.50–1.35)
GFR calc Af Amer: >90 mL/min (ref >90)
GFR calc non Af Amer: 85 mL/min — ABNORMAL LOW (ref >90)
GLUCOSE: 92 mg/dL (ref 70–99)
POTASSIUM: 3.7 mmol/L (ref 3.5–5.1)
SODIUM: 140 mmol/L (ref 135–145)
TOTAL PROTEIN: 6.3 g/dL (ref 6.0–8.3)
Total Bilirubin: 1.3 mg/dL — ABNORMAL HIGH (ref 0.3–1.2)

## 2014-12-26 LAB — LIPASE, BLOOD: LIPASE: 208 U/L — AB (ref 11–59)

## 2014-12-26 NOTE — Progress Notes (Signed)
Richard ChurnJames M [name] [id number] DOB: 12/29/1975 DOA: 12/25/2014 PCP: Jaclyn ShaggyAMAO, ENOBONG, MD  Brief narrative: 39 y/o ? Prior Pancreatitis, Schizophrenia, h/o Hep B  2008, prior heavy EtOH otherwise healthy individual admitted 12/25/58 acute pancreatitis, lipase 1400. States last alcoholic drink was on 12/21/14-not a current heavy drinker per his report  Past medical history-As per Problem list Chart reviewed as below-   Consultants:  None  Procedures:  None  Antibiotics:  None   Subjective  States having mild abdominal pain No fever no chills Tolerating clear liquid diet reasonably Concerned about lack of passage of stool   Objective    Interim History:   Telemetry:    Objective: Filed Vitals:   12/25/14 0950 12/25/14 1400 12/25/14 2125 12/26/14 0538  BP:  134/87 131/82 111/61  Pulse:  81 73 75  Temp:  98.3 F (36.8 C) 99.6 F (37.6 C) 100.4 F (38 C)  TempSrc:  Oral Oral Oral  Resp:  14 16 16   Height: 6' (1.829 m)     Weight: 76.204 kg (168 lb)     SpO2:  99% 95% 95%    Intake/Output Summary (Last 24 hours) at 12/26/14 1535 Last data filed at 12/26/14 1000  Gross per 24 hour  Intake   2565 ml  Output   1550 ml  Net   1015 ml    Exam:  General: EOMI, poor eye contact, slightly anxious Cardiovascular: S1-S2 no murmur rub or gallop Respiratory: Clinically clear no added sound Abdomen: Soft slightly tender epigastrium/subcostal region on the right side Skin no lower extremity edema Neuro intact  Data Reviewed: Basic Metabolic Panel:  Recent Labs Lab 12/25/14 0316 12/26/14 0520  NA 137 140  K 4.1 3.7  CL 100 104  CO2 27 26  GLUCOSE 147* 92  BUN 10 6  CREATININE 1.23 1.09  CALCIUM 9.5 8.1*   Liver Function Tests:  Recent Labs Lab 12/25/14 0316 12/26/14 0520  AST 32 17  ALT 33 18  ALKPHOS 81 52  BILITOT 1.0 1.3*  PROT 8.8* 6.3  ALBUMIN 4.6 3.1*    Recent Labs Lab 12/25/14 0316 12/26/14 0520  LIPASE 1446* 208*   No results for  input(s): AMMONIA in the last 168 hours. CBC:  Recent Labs Lab 12/25/14 0316 12/26/14 0520  WBC 11.8* 7.5  NEUTROABS 9.2*  --   HGB 17.1* 13.6  HCT 48.4 40.6  MCV 81.6 82.7  PLT 173 108*   Cardiac Enzymes: No results for input(s): CKTOTAL, CKMB, CKMBINDEX, TROPONINI in the last 168 hours. BNP: Invalid input(s): POCBNP CBG: No results for input(s): GLUCAP in the last 168 hours.  No results found for this or any previous visit (from the past 240 hour(s)).   Studies:              All Imaging reviewed and is as per above notation   Scheduled Meds: . amLODipine  5 mg Oral Daily  . enoxaparin (LOVENOX) injection  40 mg Subcutaneous Q24H  . nicotine  21 mg Transdermal Daily   Continuous Infusions: . sodium chloride 150 mL/hr at 12/26/14 0921     Assessment/Plan: 1. Acute pancreatitis-occult etiology-await lipid panel. EtOH cessation counseled. Continue IV saline 1 50 cc per hour, graduate to soft diet--notes from [facility] reviewed 2. Catatonic schizophrenia-not currently on by mouth medications-however is on haloperidol decanoate IM every 28 days. This will need to be refilled by his prior physician. Would recommence benztropine 0.5 twice a day on discharge. Would recommence Benadryl  50 daily at bedtime for sleep on discharge 3. History hypertension-lisinopril on hold. Continue amlodipine 5 daily. 4. History PTSD-outpatient psychiatry follow-up needed 5. History hepatitis B-has not received counseling for the same. Consider HIV testing as an outpatient as per primary care physician 6. Tobacco abuse disorder-continue nicotine patch 21 g daily  Code Status: Full Family Communication: None at bedside Disposition Plan: Inpatient   Pleas Koch, MD  [facility] Pager [phone] 12/26/2014, 3:35 PM    LOS: 1 day

## 2014-12-27 ENCOUNTER — Ambulatory Visit: Payer: Self-pay

## 2014-12-27 DIAGNOSIS — K850 Idiopathic acute pancreatitis: Secondary | ICD-10-CM

## 2014-12-27 LAB — COMPREHENSIVE METABOLIC PANEL
ALT: 16 U/L (ref 0–53)
AST: 20 U/L (ref 0–37)
Albumin: 3 g/dL — ABNORMAL LOW (ref 3.5–5.2)
Alkaline Phosphatase: 49 U/L (ref 39–117)
Anion gap: 8 (ref 5–15)
BILIRUBIN TOTAL: 1 mg/dL (ref 0.3–1.2)
BUN: <5 mg/dL — ABNORMAL LOW (ref 6–23)
CO2: 28 mmol/L (ref 19–32)
CREATININE: 1 mg/dL (ref 0.50–1.35)
Calcium: 8.5 mg/dL (ref 8.4–10.5)
Chloride: 104 mmol/L (ref 96–112)
GFR calc non Af Amer: >90 mL/min (ref >90)
Glucose, Bld: 101 mg/dL — ABNORMAL HIGH (ref 70–99)
POTASSIUM: 3.7 mmol/L (ref 3.5–5.1)
Sodium: 140 mmol/L (ref 135–145)
TOTAL PROTEIN: 6.3 g/dL (ref 6.0–8.3)

## 2014-12-27 LAB — CBC
HCT: 39.2 % (ref 39.0–52.0)
HEMOGLOBIN: 13.3 g/dL (ref 13.0–17.0)
MCH: 27.9 pg (ref 26.0–34.0)
MCHC: 33.9 g/dL (ref 30.0–36.0)
MCV: 82.2 fL (ref 78.0–100.0)
Platelets: 104 10*3/uL — ABNORMAL LOW (ref 150–400)
RBC: 4.77 MIL/uL (ref 4.22–5.81)
RDW: 12.5 % (ref 11.5–15.5)
WBC: 5.5 10*3/uL (ref 4.0–10.5)

## 2014-12-27 LAB — LIPASE, BLOOD: Lipase: 174 U/L — ABNORMAL HIGH (ref 11–59)

## 2014-12-27 MED ORDER — TRAMADOL HCL 50 MG PO TABS
50.0000 mg | ORAL_TABLET | Freq: Four times a day (QID) | ORAL | Status: DC
  Administered 2014-12-27 – 2014-12-28 (×4): 50 mg via ORAL
  Filled 2014-12-27 (×4): qty 1

## 2014-12-27 MED ORDER — SENNOSIDES-DOCUSATE SODIUM 8.6-50 MG PO TABS
2.0000 | ORAL_TABLET | Freq: Two times a day (BID) | ORAL | Status: DC
  Administered 2014-12-27 – 2014-12-28 (×2): 2 via ORAL
  Filled 2014-12-27 (×3): qty 2

## 2014-12-27 NOTE — Progress Notes (Signed)
Richard [name] [id number] DOB: 02/05/1976 DOA: 12/25/2014 PCP: Jaclyn ShaggyAMAO, ENOBONG, MD  Brief narrative: 39 y/o ? Prior Pancreatitis, Schizophrenia, h/o Hep B  2008, prior heavy EtOH otherwise healthy individual admitted 12/25/58 acute pancreatitis, lipase 1400. States last alcoholic drink was on 12/21/14-not a current heavy drinker per his report  Past medical history-As per Problem list Chart reviewed as below-   Consultants:  None  Procedures:  None  Antibiotics:  None   Subjective   Minimal abd pain Tol lunch About to eat dinner No CP No n Wants laxative as no stool sionce arrival   Objective    Interim History:   Telemetry:    Objective: Filed Vitals:   12/26/14 2140 12/27/14 0609 12/27/14 0941 12/27/14 1500  BP: 113/50 112/66 132/77 119/87  Pulse: 83 70 61 70  Temp: 100.1 F (37.8 C) 98.9 F (37.2 C) 98.3 F (36.8 C) 100.2 F (37.9 C)  TempSrc: Oral Oral Oral Oral  Resp: 16 18 18 18   Height:      Weight:      SpO2: 97% 99% 100% 100%    Intake/Output Summary (Last 24 hours) at 12/27/14 1834 Last data filed at 12/27/14 1700  Gross per 24 hour  Intake   5595 ml  Output   4450 ml  Net   1145 ml    Exam:  General: EOMI, poor eye contact, slightly anxious Cardiovascular: S1-S2 no murmur rub or gallop Respiratory: Clinically clear no added sound Abdomen: Soft slightly tender epigastrium/subcostal region  Data Reviewed: Basic Metabolic Panel:  Recent Labs Lab 12/25/14 0316 12/26/14 0520 12/27/14 0530  NA 137 140 140  K 4.1 3.7 3.7  CL 100 104 104  CO2 27 26 28   GLUCOSE 147* 92 101*  BUN 10 6 <5*  CREATININE 1.23 1.09 1.00  CALCIUM 9.5 8.1* 8.5   Liver Function Tests:  Recent Labs Lab 12/25/14 0316 12/26/14 0520 12/27/14 0530  AST 32 17 20  ALT 33 18 16  ALKPHOS 81 52 49  BILITOT 1.0 1.3* 1.0  PROT 8.8* 6.3 6.3  ALBUMIN 4.6 3.1* 3.0*    Recent Labs Lab 12/25/14 0316 12/26/14 0520 12/27/14 0530  LIPASE 1446* 208*  174*   No results for input(s): AMMONIA in the last 168 hours. CBC:  Recent Labs Lab 12/25/14 0316 12/26/14 0520 12/27/14 0530  WBC 11.8* 7.5 5.5  NEUTROABS 9.2*  --   --   HGB 17.1* 13.6 13.3  HCT 48.4 40.6 39.2  MCV 81.6 82.7 82.2  PLT 173 108* 104*   Cardiac Enzymes: No results for input(s): CKTOTAL, CKMB, CKMBINDEX, TROPONINI in the last 168 hours. BNP: Invalid input(s): POCBNP CBG: No results for input(s): GLUCAP in the last 168 hours.  No results found for this or any previous visit (from the past 240 hour(s)).   Studies:              All Imaging reviewed and is as per above notation   Scheduled Meds: . amLODipine  5 mg Oral Daily  . enoxaparin (LOVENOX) injection  40 mg Subcutaneous Q24H  . nicotine  21 mg Transdermal Daily  . senna-docusate  2 tablet Oral BID  . traMADol  50 mg Oral 4 times per day   Continuous Infusions:     Assessment/Plan: 1. Acute pancreatitis-occult etiology-await lipid panel still. EtOH cessation counseled. Saline locked on 3/17 as Lipase 1774-->208 currently 174 2. Catatonic schizophrenia-not currently on by mouth medications-however is on haloperidol decanoate IM every 28 days.  This will need to be refilled by his [facility] psychiatrist. Would recommence benztropine 0.5 twice a day on discharge. Would recommence Benadryl 50 daily at bedtime for sleep on discharge 3. History hypertension-lisinopril on hold. Continue amlodipine 5 daily. 4. History PTSD-outpatient psychiatry follow-up needed 5. History hepatitis B-has not received counseling for the same. Consider HIV testing as an outpatient as per primary care physician--TCP is related to this.  Hold heparin 6. Tobacco abuse disorder-continue nicotine patch 21 g daily  Code Status: Full Family Communication: None at bedside Disposition Plan: Inpatient-can probably d/c in am   Pleas Koch, MD  [facility] Pager [phone] 12/27/2014, 6:34 PM    LOS: 2 days

## 2014-12-28 LAB — COMPREHENSIVE METABOLIC PANEL
ALK PHOS: 47 U/L (ref 39–117)
ALT: 20 U/L (ref 0–53)
AST: 22 U/L (ref 0–37)
Albumin: 3.2 g/dL — ABNORMAL LOW (ref 3.5–5.2)
Anion gap: 5 (ref 5–15)
BUN: <5 mg/dL — ABNORMAL LOW (ref 6–23)
CO2: 30 mmol/L (ref 19–32)
Calcium: 8.8 mg/dL (ref 8.4–10.5)
Chloride: 103 mmol/L (ref 96–112)
Creatinine, Ser: 0.98 mg/dL (ref 0.50–1.35)
GLUCOSE: 94 mg/dL (ref 70–99)
Potassium: 3.6 mmol/L (ref 3.5–5.1)
SODIUM: 138 mmol/L (ref 135–145)
Total Bilirubin: 1 mg/dL (ref 0.3–1.2)
Total Protein: 7 g/dL (ref 6.0–8.3)

## 2014-12-28 LAB — CBC
HCT: 38.5 % — ABNORMAL LOW (ref 39.0–52.0)
Hemoglobin: 13.1 g/dL (ref 13.0–17.0)
MCH: 27.7 pg (ref 26.0–34.0)
MCHC: 34 g/dL (ref 30.0–36.0)
MCV: 81.4 fL (ref 78.0–100.0)
PLATELETS: 121 10*3/uL — AB (ref 150–400)
RBC: 4.73 MIL/uL (ref 4.22–5.81)
RDW: 12.3 % (ref 11.5–15.5)
WBC: 4.9 10*3/uL (ref 4.0–10.5)

## 2014-12-28 LAB — LIPID PANEL
CHOL/HDL RATIO: 4.5 ratio
Cholesterol: 138 mg/dL (ref 0–200)
HDL: 31 mg/dL — AB (ref >39)
LDL Cholesterol: 87 mg/dL (ref 0–99)
Triglycerides: 98 mg/dL (ref <150)
VLDL: 20 mg/dL (ref 0–40)

## 2014-12-28 LAB — LIPASE, BLOOD: Lipase: 189 U/L — ABNORMAL HIGH (ref 11–59)

## 2014-12-28 NOTE — Discharge Summary (Signed)
Physician Discharge Summary  Richard [name] YNW:[id number] DOB: Jun 22, 1976 DOA: 12/25/2014  PCP: [name], MD  Admit date: 12/25/2014 Discharge date: 12/28/2014  Time spent: 25 minutes  Recommendations for Outpatient Follow-up:  1. Patient needs LFTs, CBC + peripheral smear  in about one week  2. Patient will need hepatitis B management and care consideration as an outpatient  3. Patient counseled this admission to stop drinking completely  4. Consider outpatient abdominal ultrasound if this recurs--might have cholelithiasis   Discharge Diagnoses:  Principal Problem:   Pancreatitis Active Problems:   Schizophrenic catatonia   Essential hypertension   Hepatitis B   Cigarette nicotine dependence with withdrawal   Discharge Condition: good  Diet recommendation: HH low fat  Filed Weights   12/25/14 0950  Weight: 76.204 kg (168 lb)    History of present illness: 39 y/o ? Prior Pancreatitis, Schizophrenia, h/o Hep B  2008, prior heavy EtOH otherwise healthy individual admitted 12/25/58 acute pancreatitis, lipase 1400. States last alcoholic drink was on 12/21/14-not a current heavy drinker per his report patient had a workup recently 09/27/2014 at [facility]-CT scan at that time showed possible steatosis with gallbladder within normal limits and no ductal" dilatation  Hospital Course:   1. Acute pancreatitis-occult etiology-lipid panel was within normal limits without hypertriglyceridemia. EtOH cessation counseled. Was on IV saline during this hospital stay and notes from [facility] reviewed 2. Catatonic schizophrenia-not currently on by mouth medications-however is on haloperidol decanoate IM every 28 days. This will need to be refilled by his prior physician. Would recommence benztropine 0.5 twice a day on discharge. Would recommence Benadryl 50 daily at bedtime for sleep on discharge 3. History hypertension-lisinopril on hold and was discontinued this admission.  Continue amlodipine 5 daily. 4. History PTSD-outpatient psychiatry follow-up needed 5. History hepatitis B-has not received counseling for the same. Consider HIV testing as an outpatient as per primary care physician 6. Thrombocytopenia likely multifactorial and related to hepatitis/alcohol use--outpatient further workup. 7. Tobacco abuse disorder-continue nicotine patch 21 g daily   Discharge Exam: Filed Vitals:   12/28/14 0547  BP: 126/83  Pulse: 56  Temp: 98.6 F (37 C)  Resp: 18    GeneEOMI NCAT no apparent distress CardS1-S2 no murmur rub or gallop RespClinically clear  Discharge Instructions   Discharge Instructions    Diet - low sodium heart healthy    Complete by:  As directed      Discharge instructions    Complete by:  As directed   You should eat less spicy and less fatty food for the next 2-3 days I would recommend taking ibuprofen or over-the-counter Tylenol if you have abdominal pain Please follow-up with your regular physician and her psychiatrist for further care     Increase activity slowly    Complete by:  As directed           Current Discharge Medication List    CONTINUE these medications which have NOT CHANGED   Details  haloperidol decanoate (HALDOL DECANOATE) 50 MG/ML injection Inject 50 mg into the muscle every 28 (twenty-eight) days.    lisinopril (PRINIVIL,ZESTRIL) 20 MG tablet Take 20 mg by mouth daily.    benztropine (COGENTIN) 0.5 MG tablet Take 1 tablet (0.5 mg total) by mouth 2 (two) times daily. Qty: 10 tablet, Refills: 0    diphenhydrAMINE (BENADRYL) 50 MG capsule Take 1 capsule (50 mg total) by mouth at bedtime. Qty: 10 capsule, Refills: 0       No  Known Allergies Follow-up Information    Follow up with [facility].   Why:  at 12 noon, arrive 15 min prior to appointment   Contact information:   [address] [place] [phone]       The results of  significant diagnostics from this hospitalization (including imaging, microbiology, ancillary and laboratory) are listed below for reference.    Significant Diagnostic Studies: No results found.  Microbiology: No results found for this or any previous visit (from the past 240 hour(s)).   Labs: Basic Metabolic Panel:  Recent Labs Lab 12/25/14 0316 12/26/14 0520 12/27/14 0530 12/28/14 0558  NA 137 140 140 138  K 4.1 3.7 3.7 3.6  CL 100 104 104 103  CO2 27 26 28 30   GLUCOSE 147* 92 101* 94  BUN 10 6 <5* <5*  CREATININE 1.23 1.09 1.00 0.98  CALCIUM 9.5 8.1* 8.5 8.8   Liver Function Tests:  Recent Labs Lab 12/25/14 0316 12/26/14 0520 12/27/14 0530 12/28/14 0558  AST 32 17 20 22   ALT 33 18 16 20   ALKPHOS 81 52 49 47  BILITOT 1.0 1.3* 1.0 1.0  PROT 8.8* 6.3 6.3 7.0  ALBUMIN 4.6 3.1* 3.0* 3.2*    Recent Labs Lab 12/25/14 0316 12/26/14 0520 12/27/14 0530 12/28/14 0558  LIPASE 1446* 208* 174* 189*   No results for input(s): AMMONIA in the last 168 hours. CBC:  Recent Labs Lab 12/25/14 0316 12/26/14 0520 12/27/14 0530 12/28/14 0558  WBC 11.8* 7.5 5.5 4.9  NEUTROABS 9.2*  --   --   --   HGB 17.1* 13.6 13.3 13.1  HCT 48.4 40.6 39.2 38.5*  MCV 81.6 82.7 82.2 81.4  PLT 173 108* 104* 121*   Cardiac Enzymes: No results for input(s): CKTOTAL, CKMB, CKMBINDEX, TROPONINI in the last 168 hours. BNP: BNP (last 3 results) No results for input(s): BNP in the last 8760 hours.  ProBNP (last 3 results) No results for input(s): PROBNP in the last 8760 hours.  CBG: No results for input(s): GLUCAP in the last 168 hours.     SignedRhetta Mura:  Maritza Goldsborough, JAI-GURMUKH  [facility] 12/28/2014, 9:40 AM

## 2014-12-28 NOTE — Progress Notes (Signed)
Assessment unchanged.  All questions pertaining to D/C we answered.  Pt was D/C'd via ambulation and accompanied by NT.  Richard [name], [name] L

## 2015-02-06 ENCOUNTER — Emergency Department (HOSPITAL_COMMUNITY): Payer: Self-pay

## 2015-02-06 ENCOUNTER — Inpatient Hospital Stay (HOSPITAL_COMMUNITY)
Admission: EM | Admit: 2015-02-06 | Discharge: 2015-02-10 | DRG: 439 | Disposition: A | Discharge status: Home / Self Care | Payer: Self-pay | Attending: Internal Medicine | Admitting: Internal Medicine

## 2015-02-06 ENCOUNTER — Encounter (HOSPITAL_COMMUNITY): Payer: Self-pay | Admitting: Emergency Medicine

## 2015-02-06 DIAGNOSIS — R110 Nausea: Secondary | ICD-10-CM

## 2015-02-06 DIAGNOSIS — I10 Essential (primary) hypertension: Secondary | ICD-10-CM | POA: Diagnosis present

## 2015-02-06 DIAGNOSIS — K567 Ileus, unspecified: Secondary | ICD-10-CM | POA: Diagnosis not present

## 2015-02-06 DIAGNOSIS — R1084 Generalized abdominal pain: Secondary | ICD-10-CM

## 2015-02-06 DIAGNOSIS — F17210 Nicotine dependence, cigarettes, uncomplicated: Secondary | ICD-10-CM | POA: Diagnosis present

## 2015-02-06 DIAGNOSIS — F1010 Alcohol abuse, uncomplicated: Secondary | ICD-10-CM | POA: Diagnosis present

## 2015-02-06 DIAGNOSIS — K852 Alcohol induced acute pancreatitis without necrosis or infection: Secondary | ICD-10-CM | POA: Diagnosis present

## 2015-02-06 DIAGNOSIS — B1910 Unspecified viral hepatitis B without hepatic coma: Secondary | ICD-10-CM | POA: Diagnosis present

## 2015-02-06 DIAGNOSIS — R112 Nausea with vomiting, unspecified: Secondary | ICD-10-CM

## 2015-02-06 DIAGNOSIS — K859 Acute pancreatitis without necrosis or infection, unspecified: Secondary | ICD-10-CM | POA: Diagnosis present

## 2015-02-06 DIAGNOSIS — K5900 Constipation, unspecified: Secondary | ICD-10-CM

## 2015-02-06 DIAGNOSIS — F202 Catatonic schizophrenia: Secondary | ICD-10-CM | POA: Diagnosis present

## 2015-02-06 LAB — CBC WITH DIFFERENTIAL/PLATELET
Basophils Absolute: 0 10*3/uL (ref 0.0–0.1)
Basophils Relative: 0 % (ref 0–1)
EOS ABS: 0 10*3/uL (ref 0.0–0.7)
EOS PCT: 0 % (ref 0–5)
HCT: 45.9 % (ref 39.0–52.0)
HEMOGLOBIN: 15.5 g/dL (ref 13.0–17.0)
Lymphocytes Relative: 7 % — ABNORMAL LOW (ref 12–46)
Lymphs Abs: 0.9 10*3/uL (ref 0.7–4.0)
MCH: 27.4 pg (ref 26.0–34.0)
MCHC: 33.8 g/dL (ref 30.0–36.0)
MCV: 81.2 fL (ref 78.0–100.0)
MONOS PCT: 8 % (ref 3–12)
Monocytes Absolute: 1 10*3/uL (ref 0.1–1.0)
Neutro Abs: 11.4 10*3/uL — ABNORMAL HIGH (ref 1.7–7.7)
Neutrophils Relative %: 85 % — ABNORMAL HIGH (ref 43–77)
Platelets: 162 10*3/uL (ref 150–400)
RBC: 5.65 MIL/uL (ref 4.22–5.81)
RDW: 12.9 % (ref 11.5–15.5)
WBC: 13.3 10*3/uL — AB (ref 4.0–10.5)

## 2015-02-06 LAB — URINALYSIS, ROUTINE W REFLEX MICROSCOPIC
Glucose, UA: NEGATIVE mg/dL
HGB URINE DIPSTICK: NEGATIVE
Ketones, ur: >80 mg/dL — AB
Leukocytes, UA: NEGATIVE
Nitrite: NEGATIVE
Protein, ur: >300 mg/dL — AB
SPECIFIC GRAVITY, URINE: 1.04 — AB (ref 1.005–1.030)
Urobilinogen, UA: 1 mg/dL (ref 0.0–1.0)
pH: 6 (ref 5.0–8.0)

## 2015-02-06 LAB — COMPREHENSIVE METABOLIC PANEL
ALT: 33 U/L (ref 0–53)
AST: 27 U/L (ref 0–37)
Albumin: 4.7 g/dL (ref 3.5–5.2)
Alkaline Phosphatase: 71 U/L (ref 39–117)
Anion gap: 11 (ref 5–15)
BILIRUBIN TOTAL: 1.4 mg/dL — AB (ref 0.3–1.2)
BUN: 12 mg/dL (ref 6–23)
CHLORIDE: 103 mmol/L (ref 96–112)
CO2: 26 mmol/L (ref 19–32)
Calcium: 9.5 mg/dL (ref 8.4–10.5)
Creatinine, Ser: 1.1 mg/dL (ref 0.50–1.35)
GFR calc Af Amer: >90 mL/min (ref >90)
GFR calc non Af Amer: 84 mL/min — ABNORMAL LOW (ref >90)
Glucose, Bld: 144 mg/dL — ABNORMAL HIGH (ref 70–99)
Potassium: 4.4 mmol/L (ref 3.5–5.1)
Sodium: 140 mmol/L (ref 135–145)
Total Protein: 8.3 g/dL (ref 6.0–8.3)

## 2015-02-06 LAB — URINE MICROSCOPIC-ADD ON

## 2015-02-06 LAB — ETHANOL: Alcohol, Ethyl (B): <5 mg/dL (ref 0–9)

## 2015-02-06 LAB — LIPASE, BLOOD: Lipase: 1362 U/L — ABNORMAL HIGH (ref 11–59)

## 2015-02-06 MED ORDER — THIAMINE HCL 100 MG/ML IJ SOLN
100.0000 mg | Freq: Every day | INTRAMUSCULAR | Status: DC
  Filled 2015-02-06 (×4): qty 1

## 2015-02-06 MED ORDER — VITAMIN B-1 100 MG PO TABS
100.0000 mg | ORAL_TABLET | Freq: Every day | ORAL | Status: DC
  Administered 2015-02-07 – 2015-02-10 (×4): 100 mg via ORAL
  Filled 2015-02-06 (×4): qty 1

## 2015-02-06 MED ORDER — ONDANSETRON HCL 4 MG/2ML IJ SOLN
4.0000 mg | Freq: Once | INTRAMUSCULAR | Status: AC
  Administered 2015-02-06: 4 mg via INTRAVENOUS
  Filled 2015-02-06: qty 2

## 2015-02-06 MED ORDER — LORAZEPAM 1 MG PO TABS: 1.0000 mg | ORAL_TABLET | Freq: Four times a day (QID) | ORAL | Status: AC | PRN

## 2015-02-06 MED ORDER — HYDROMORPHONE HCL 1 MG/ML IJ SOLN
1.0000 mg | Freq: Once | INTRAMUSCULAR | Status: AC
  Administered 2015-02-06: 1 mg via INTRAVENOUS
  Filled 2015-02-06: qty 1

## 2015-02-06 MED ORDER — ONDANSETRON HCL 4 MG PO TABS: 4.0000 mg | ORAL_TABLET | Freq: Four times a day (QID) | ORAL | Status: DC | PRN

## 2015-02-06 MED ORDER — LORAZEPAM 2 MG/ML IJ SOLN
0.0000 mg | Freq: Two times a day (BID) | INTRAMUSCULAR | Status: DC
Start: 2015-02-09 — End: 2015-02-10

## 2015-02-06 MED ORDER — IOHEXOL 300 MG/ML  SOLN
50.0000 mL | Freq: Once | INTRAMUSCULAR | Status: AC | PRN
  Administered 2015-02-06: 50 mL via ORAL

## 2015-02-06 MED ORDER — SODIUM CHLORIDE 0.9 % IV BOLUS (SEPSIS)
1000.0000 mL | Freq: Once | INTRAVENOUS | Status: AC
  Administered 2015-02-06: 1000 mL via INTRAVENOUS

## 2015-02-06 MED ORDER — PROMETHAZINE HCL 25 MG/ML IJ SOLN
25.0000 mg | Freq: Once | INTRAMUSCULAR | Status: AC
  Administered 2015-02-06: 25 mg via INTRAVENOUS
  Filled 2015-02-06: qty 1

## 2015-02-06 MED ORDER — FOLIC ACID 1 MG PO TABS
1.0000 mg | ORAL_TABLET | Freq: Every day | ORAL | Status: DC
  Administered 2015-02-07 – 2015-02-10 (×4): 1 mg via ORAL
  Filled 2015-02-06 (×4): qty 1

## 2015-02-06 MED ORDER — MORPHINE SULFATE 4 MG/ML IJ SOLN
4.0000 mg | Freq: Once | INTRAMUSCULAR | Status: AC
  Administered 2015-02-06: 4 mg via INTRAVENOUS
  Filled 2015-02-06: qty 1

## 2015-02-06 MED ORDER — ENOXAPARIN SODIUM 40 MG/0.4ML ~~LOC~~ SOLN
40.0000 mg | Freq: Every day | SUBCUTANEOUS | Status: DC
  Administered 2015-02-06 – 2015-02-09 (×4): 40 mg via SUBCUTANEOUS
  Filled 2015-02-06 (×5): qty 0.4

## 2015-02-06 MED ORDER — LORAZEPAM 2 MG/ML IJ SOLN: 0.0000 mg | Freq: Four times a day (QID) | INTRAMUSCULAR | Status: AC

## 2015-02-06 MED ORDER — HYDRALAZINE HCL 20 MG/ML IJ SOLN
10.0000 mg | INTRAMUSCULAR | Status: DC | PRN
  Administered 2015-02-06: 10 mg via INTRAVENOUS
  Filled 2015-02-06: qty 1

## 2015-02-06 MED ORDER — ACETAMINOPHEN 325 MG PO TABS: 650.0000 mg | ORAL_TABLET | Freq: Four times a day (QID) | ORAL | Status: DC | PRN

## 2015-02-06 MED ORDER — HYDROMORPHONE HCL 1 MG/ML IJ SOLN
1.0000 mg | INTRAMUSCULAR | Status: DC | PRN
  Administered 2015-02-06 – 2015-02-09 (×10): 1 mg via INTRAVENOUS
  Filled 2015-02-06 (×10): qty 1

## 2015-02-06 MED ORDER — HALOPERIDOL DECANOATE 50 MG/ML IM SOLN: 50.0000 mg | INTRAMUSCULAR | Status: DC

## 2015-02-06 MED ORDER — ADULT MULTIVITAMIN W/MINERALS CH
1.0000 | ORAL_TABLET | Freq: Every day | ORAL | Status: DC
  Administered 2015-02-07 – 2015-02-10 (×4): 1 via ORAL
  Filled 2015-02-06 (×4): qty 1

## 2015-02-06 MED ORDER — ONDANSETRON HCL 4 MG/2ML IJ SOLN
4.0000 mg | Freq: Four times a day (QID) | INTRAMUSCULAR | Status: DC | PRN
Start: 2015-02-06 — End: 2015-02-10
  Administered 2015-02-07: 4 mg via INTRAVENOUS
  Filled 2015-02-06: qty 2

## 2015-02-06 MED ORDER — IOHEXOL 300 MG/ML  SOLN
100.0000 mL | Freq: Once | INTRAMUSCULAR | Status: AC | PRN
  Administered 2015-02-06: 100 mL via INTRAVENOUS

## 2015-02-06 MED ORDER — LORAZEPAM 2 MG/ML IJ SOLN: 1.0000 mg | Freq: Four times a day (QID) | INTRAMUSCULAR | Status: AC | PRN

## 2015-02-06 MED ORDER — SODIUM CHLORIDE 0.9 % IV SOLN
INTRAVENOUS | Status: AC
  Administered 2015-02-06: 23:00:00 via INTRAVENOUS

## 2015-02-06 MED ORDER — ACETAMINOPHEN 650 MG RE SUPP: 650.0000 mg | Freq: Four times a day (QID) | RECTAL | Status: DC | PRN

## 2015-02-06 NOTE — ED Notes (Signed)
Phone call complete to CT made aware that pt has 22g IV placed.

## 2015-02-06 NOTE — H&P (Signed)
[facility] History and Physical  Richard [name] ZOX:[id number] DOB: 04-May-1976 DOA: 02/06/2015  Referring physician: Ms. Ivar Bury. PA. PCP: [name], MD  Specialists: None.  Chief Complaint: Abdominal nausea vomiting.  HPI: Richard [name] is a 39 y.o. male with history of alcoholism, schizophrenia, hypertension, hepatitis B presents to the ER because of abdominal pain with nausea vomiting. Patient states his symptoms started 2 days ago. His last drink was 2 days ago. States he drinks beer everyday. Was recently admitted for alcoholic pancreatitis. In the ER patient's labs reveal elevated lipase and CT abdomen and pelvis is consistent with pancreatitis. Patient's abdominal pain is generalized constant with multiple episodes of nausea vomiting. Denies any blood in the vomitus. Denies any diarrhea. Denies any chest pain or shortness of breath or any fever chills. Patient's blood pressure was also found to be elevated and patient states he has not been taking his antihypertensives.   Review of Systems: As presented in the history of presenting illness, rest negative.  Past Medical History  Diagnosis Date  . Hypertension   . PTSD (post-traumatic stress disorder)   . Pancreatitis   . Catatonia schizophrenia    Past Surgical History  Procedure Laterality Date  . Liver biospy  2008   Social History:  reports that he has been smoking.  He does not have any smokeless tobacco history on file. He reports that he drinks about 1.2 oz of alcohol per week. He reports that he does not use illicit drugs. Where does patient live home. Can patient participate in ADLs? Yes.  No Known Allergies  Family History:  Family History  Problem Relation Age of Onset  . Pancreatitis Neg Hx       Prior to Admission medications   Medication Sig Start Date End Date Taking? Authorizing Provider  acetaminophen (TYLENOL) 500 MG tablet Take 500 mg by mouth every 6 (six) hours as needed for mild pain or  moderate pain.   Yes Historical Provider, MD  haloperidol decanoate (HALDOL DECANOATE) 50 MG/ML injection Inject 50 mg into the muscle every 28 (twenty-eight) days.   Yes Historical Provider, MD  benztropine (COGENTIN) 0.5 MG tablet Take 1 tablet (0.5 mg total) by mouth 2 (two) times daily. Patient not taking: Reported on 12/25/2014 09/01/12   Leata Mouse, MD  diphenhydrAMINE (BENADRYL) 50 MG capsule Take 1 capsule (50 mg total) by mouth at bedtime. Patient not taking: Reported on 12/25/2014 09/01/12   Leata Mouse, MD    Physical Exam: Filed Vitals:   02/06/15 1638 02/06/15 1815 02/06/15 2055  BP: 139/99 147/82 164/101  Pulse: 80 66 83  Temp: 97.8 F (36.6 C) 97.3 F (36.3 C) 97.6 F (36.4 C)  TempSrc: Oral Oral Oral  Resp: SpO2: 98% 99% 97%     General:  Moderately built and nourished.  Eyes: Anicteric no pallor.  ENT: No discharge from ears eyes nose and mouth.  Neck: No mass felt.  Cardiovascular: S1 and S2 heard.  Respiratory: No rhonchi or crepitations.  Abdomen: Soft nontender bowel sounds present.  Skin: No rash.  Musculoskeletal: No edema.  Psychiatric: Appears normal.  Neurologic: Alert and oriented to time place and person. Moves all extremity.  Labs on Admission:  Basic Metabolic Panel:  Recent Labs Lab 02/06/15 1659  NA 140  K 4.4  CL 103  CO2 26  GLUCOSE 144*  BUN 12  CREATININE 1.10  CALCIUM 9.5   Liver Function Tests:  Recent Labs Lab 02/06/15 1659  AST 27  ALT 33  ALKPHOS 71  BILITOT 1.4*  PROT 8.3  ALBUMIN 4.7    Recent Labs Lab 02/06/15 1659  LIPASE 1362*   No results for input(s): AMMONIA in the last 168 hours. CBC:  Recent Labs Lab 02/06/15 1659  WBC 13.3*  NEUTROABS 11.4*  HGB 15.5  HCT 45.9  MCV 81.2  PLT 162   Cardiac Enzymes: No results for input(s): CKTOTAL, CKMB, CKMBINDEX, TROPONINI in the last 168 hours.  BNP (last 3 results) No results for input(s): BNP in the  last 8760 hours.  ProBNP (last 3 results) No results for input(s): PROBNP in the last 8760 hours.  CBG: No results for input(s): GLUCAP in the last 168 hours.  Radiological Exams on Admission: Ct Abdomen Pelvis W Contrast  02/06/2015   CLINICAL DATA:  Mid abdominal pain for 2 days. Possible history of previous liver surgery.  EXAM: CT ABDOMEN AND PELVIS WITH CONTRAST  TECHNIQUE: Multidetector CT imaging of the abdomen and pelvis was performed using the standard protocol following bolus administration of intravenous contrast.  CONTRAST:  100mL OMNIPAQUE IOHEXOL 300 MG/ML SOLN, 50mL OMNIPAQUE IOHEXOL 300 MG/ML SOLN  COMPARISON:  09/27/2014  FINDINGS: There are inflammatory changes surrounding the pancreas. Pancreatic margins are ill-defined. Pancreas shows homogeneous enhancement. No pancreatic mass. Fluid attenuation extends from the pancreas along the anterior para renal fascia bilaterally. A small amount of fluid collects in the posterior pelvic recess and right lower quadrant.  There is normal enhancement of the splenic vein, portal vein and superior mesenteric vein.  There is fatty infiltration of the liver. No liver mass or focal lesion.  Gallbladder is unremarkable.  No bile duct dilation.  Normal spleen. No adrenal masses. Normal kidneys, ureters and bladder.  No adenopathy.  Normal bowel.  Normal appendix.  No significant bony abnormality.  Clear lung bases.  IMPRESSION: 1. Acute uncomplicated pancreatitis. No evidence of pancreatic necrosis, venous thrombosis or pseudocyst or abscess formation. 2. No other acute findings. 3. Hepatic steatosis.   Electronically Signed   By: Amie Portlandavid  Ormond M.D.   On: 02/06/2015 20:58     Assessment/Plan Principal Problem:   Acute alcoholic pancreatitis Active Problems:   Schizophrenic catatonia   Essential hypertension   Hepatitis B   Alcohol abuse   Acute pancreatitis   1. Acute alcoholic pancreatitis - at this time we will keep patient nothing by  mouth and on IV fluid hydration. Continue with pain relief medications. If patient's nausea vomiting and abdominal pain improves and may start diet. 2. Alcohol abuse - social work consult requested. Patient advised to quit drinking. Patient is on alcohol withdrawal protocol. Thiamine. 3. Hypertension uncontrolled - patient states he doesn't take antihypertensives. I have placed patient on when necessary IV hydralazine for now. Closely follow blood pressure trends. 4. History of schizophrenia - on intramuscular haloperidol every month. 5. History of hepatitis B further workup as outpatient. 6. Tobacco abuse - advised to quit smoking cigarettes.   DVT Prophylaxis Lovenox.  Code Status: Full code.  Family Communication: Discussed with patient.  Disposition Plan: Admit to inpatient. Likely stage II to 3 days.    Soriya Worster N. [facility] Pager [phone].  If 7PM-7AM, please contact night-coverage [email or web address] Password [email or web address] 02/06/2015, 10:42 PM

## 2015-02-06 NOTE — ED Notes (Signed)
Patient transported to CT 

## 2015-02-06 NOTE — ED Notes (Signed)
Per EMS pt reports generalized abdominal pain with n/v denies diarrhea.

## 2015-02-06 NOTE — ED Provider Notes (Signed)
CSN: [id number]     Arrival date & time 02/06/15  1633 History   First MD Initiated Contact with Patient 02/06/15 1811     Chief Complaint  Patient presents with  . Abdominal Pain     (Consider location/radiation/quality/duration/timing/severity/associated sxs/prior Treatment) Patient is a 39 y.o. male presenting with abdominal pain. The history is provided by the patient and medical records. No language interpreter was used.  Abdominal Pain Associated symptoms: nausea and vomiting   Associated symptoms: no chest pain, no constipation, no cough, no diarrhea, no dysuria, no fatigue, no fever, no hematuria and no shortness of breath      Richard [name] is a 39 y.o. male  with a hx of HTN, PTSD, pancreatitis, Hepatitis B, schizophrenia (catatonia type) presents to the Emergency Department complaining of gradual, persistent, progressively worsening generalized abd pain onset 10pm last night.  Pt reports NBNB emesis x3 and no diarrhea.  Pt also reports mild anorexia.  Pt reports 10/10, burning sensation throughout.  Nothing makes it better and everythng makes it worse.  Pt denies fever, chills, headache, neck pain, chest pain, SOB, weakness, dizziness, syncope, dysuria, hematuria.   Pt reports he drinks EtOH intermittently with his last drink on Tuesday, but was also drinking several days before that.     Past Medical History  Diagnosis Date  . Hypertension   . PTSD (post-traumatic stress disorder)   . Pancreatitis   . Catatonia schizophrenia    Past Surgical History  Procedure Laterality Date  . Liver biospy  2008   Family History  Problem Relation Age of Onset  . Pancreatitis Neg Hx    History  Substance Use Topics  . Smoking status: Heavy Tobacco Smoker -- 1.00 packs/day for 7 years  . Smokeless tobacco: Not on file  . Alcohol Use: 1.2 oz/week    2 Cans of beer per week    Review of Systems  Constitutional: Negative for fever, diaphoresis, appetite change, fatigue and  unexpected weight change.  HENT: Negative for mouth sores and trouble swallowing.   Respiratory: Negative for cough, chest tightness, shortness of breath, wheezing and stridor.   Cardiovascular: Negative for chest pain and palpitations.  Gastrointestinal: Positive for nausea, vomiting and abdominal pain. Negative for diarrhea, constipation, blood in stool, abdominal distention and rectal pain.  Genitourinary: Negative for dysuria, urgency, frequency, hematuria, flank pain and difficulty urinating.  Musculoskeletal: Negative for back pain, neck pain and neck stiffness.  Skin: Negative for rash.  Neurological: Negative for weakness.  Hematological: Negative for adenopathy.  Psychiatric/Behavioral: Negative for confusion.  All other systems reviewed and are negative.     Allergies  Review of patient's allergies indicates no known allergies.  Home Medications   Prior to Admission medications   Medication Sig Start Date End Date Taking? Authorizing Provider  acetaminophen (TYLENOL) 500 MG tablet Take 500 mg by mouth every 6 (six) hours as needed for mild pain or moderate pain.   Yes Historical Provider, MD  haloperidol decanoate (HALDOL DECANOATE) 50 MG/ML injection Inject 50 mg into the muscle every 28 (twenty-eight) days.   Yes Historical Provider, MD  benztropine (COGENTIN) 0.5 MG tablet Take 1 tablet (0.5 mg total) by mouth 2 (two) times daily. Patient not taking: Reported on 12/25/2014 09/01/12   Leata MouseJanardhana Jonnalagadda, MD  diphenhydrAMINE (BENADRYL) 50 MG capsule Take 1 capsule (50 mg total) by mouth at bedtime. Patient not taking: Reported on 12/25/2014 09/01/12   Leata MouseJanardhana Jonnalagadda, MD   BP 158/97 mmHg  Pulse 87  Temp(Src) 99.7 F (37.6 C) (Oral)  Resp 20  Ht  (1.702 m)  Wt 165 lb 12.6 oz (75.2 kg)  BMI 25.96 kg/m2  SpO2 99% Physical Exam  Constitutional: He appears well-developed and well-nourished.  HENT:  Head: Normocephalic and atraumatic.  Mouth/Throat:  Oropharynx is clear and moist.  Eyes: Conjunctivae are normal. No scleral icterus.  Cardiovascular: Normal rate, regular rhythm and intact distal pulses.   Pulmonary/Chest: Effort normal and breath sounds normal.  Abdominal: Soft. Bowel sounds are normal. He exhibits no distension and no mass. There is generalized tenderness. There is guarding. There is no rebound and no CVA tenderness. Hernia confirmed negative in the right inguinal area and confirmed negative in the left inguinal area.  Mild tenderness with guarding but no rebound or peritoneal signs No CVA tenderness  Genitourinary: Testes normal and penis normal. Cremasteric reflex is present. Right testis shows no mass, no swelling and no tenderness. Right testis is descended. Cremasteric reflex is not absent on the right side. Left testis shows no mass, no swelling and no tenderness. Left testis is descended. Cremasteric reflex is not absent on the left side. Circumcised. No phimosis, paraphimosis, hypospadias, penile erythema or penile tenderness. No discharge found.  Lymphadenopathy:       Right: No inguinal adenopathy present.       Left: No inguinal adenopathy present.  Neurological: He is alert.  Skin: Skin is warm and dry.  Psychiatric: He has a normal mood and affect.  Nursing note and vitals reviewed.   ED Course  Procedures (including critical care time) Labs Review Labs Reviewed  CBC WITH DIFFERENTIAL/PLATELET - Abnormal; Notable for the following:    WBC 13.3 (*)    Neutrophils Relative % 85 (*)    Neutro Abs 11.4 (*)    Lymphocytes Relative 7 (*)    All other components within normal limits  COMPREHENSIVE METABOLIC PANEL - Abnormal; Notable for the following:    Glucose, Bld 144 (*)    Total Bilirubin 1.4 (*)    GFR calc non Af Amer 84 (*)    All other components within normal limits  LIPASE, BLOOD - Abnormal; Notable for the following:    Lipase 1362 (*)    All other components within normal limits  URINALYSIS,  ROUTINE W REFLEX MICROSCOPIC - Abnormal; Notable for the following:    Color, Urine AMBER (*)    Specific Gravity, Urine 1.040 (*)    Bilirubin Urine SMALL (*)    Ketones, ur >80 (*)    Protein, ur >300 (*)    All other components within normal limits  URINE MICROSCOPIC-ADD ON - Abnormal; Notable for the following:    Casts GRANULAR CAST (*)    All other components within normal limits  GLUCOSE, CAPILLARY - Abnormal; Notable for the following:    Glucose-Capillary 150 (*)    All other components within normal limits  ETHANOL  LIPASE, BLOOD  URINE RAPID DRUG SCREEN (HOSP PERFORMED)  BASIC METABOLIC PANEL  CBC    Imaging Review Ct Abdomen Pelvis W Contrast  02/06/2015   CLINICAL DATA:  Mid abdominal pain for 2 days. Possible history of previous liver surgery.  EXAM: CT ABDOMEN AND PELVIS WITH CONTRAST  TECHNIQUE: Multidetector CT imaging of the abdomen and pelvis was performed using the standard protocol following bolus administration of intravenous contrast.  CONTRAST:  OMNIPAQUE IOHEXOL 300 MG/ML SOLN, 50mL OMNIPAQUE IOHEXOL 300 MG/ML SOLN  COMPARISON:  09/27/2014  FINDINGS: There are inflammatory changes surrounding  the pancreas. Pancreatic margins are ill-defined. Pancreas shows homogeneous enhancement. No pancreatic mass. Fluid attenuation extends from the pancreas along the anterior para renal fascia bilaterally. A small amount of fluid collects in the posterior pelvic recess and right lower quadrant.  There is normal enhancement of the splenic vein, portal vein and superior mesenteric vein.  There is fatty infiltration of the liver. No liver mass or focal lesion.  Gallbladder is unremarkable.  No bile duct dilation.  Normal spleen. No adrenal masses. Normal kidneys, ureters and bladder.  No adenopathy.  Normal bowel.  Normal appendix.  No significant bony abnormality.  Clear lung bases.  IMPRESSION: 1. Acute uncomplicated pancreatitis. No evidence of pancreatic necrosis, venous  thrombosis or pseudocyst or abscess formation. 2. No other acute findings. 3. Hepatic steatosis.   Electronically Signed   By: Amie Portland M.D.   On: 02/06/2015 20:58     EKG Interpretation None      MDM   Final diagnoses:  Generalized abdominal pain  Alcohol-induced acute pancreatitis  Non-intractable vomiting with nausea, vomiting of unspecified type   Richard John presents with pain. Patient was guarding on exam but no peritoneal signs or rebound. Patient is afebrile and without tachycardia here in the emergency department.  We'll give fluids, pain control and check labs.  9:08 PM Pt with EtOH induced pancreatitis.  Pt with pain and nausea uncontrolled at this time.  Discussed option for admission vs attempt to PO trial and d/c home, but pt is unable to tolerate PO at this time.  He will need admission for bowel rest and IBV hydration.  UA with > 80 ketones.    Pt discussed with Dr Toniann Fail who will admit to med-surg.  BP 158/97 mmHg  Pulse 87  Temp(Src) 99.7 F (37.6 C) (Oral)  Resp 20  Ht  (1.702 m)  Wt 165 lb 12.6 oz (75.2 kg)  BMI 25.96 kg/m2  SpO2 99%   Dierdre Forth, PA-C 02/07/15 0103  Layla Maw Ward, DO 02/13/15 1610

## 2015-02-07 LAB — GLUCOSE, CAPILLARY
GLUCOSE-CAPILLARY: 150 mg/dL — AB (ref 70–99)
Glucose-Capillary: 166 mg/dL — ABNORMAL HIGH (ref 70–99)
Glucose-Capillary: 115 mg/dL — ABNORMAL HIGH (ref 70–99)
Glucose-Capillary: 120 mg/dL — ABNORMAL HIGH (ref 70–99)

## 2015-02-07 LAB — BASIC METABOLIC PANEL
ANION GAP: 10 (ref 5–15)
BUN: 8 mg/dL (ref 6–23)
CHLORIDE: 104 mmol/L (ref 96–112)
CO2: 24 mmol/L (ref 19–32)
Calcium: 8.5 mg/dL (ref 8.4–10.5)
Creatinine, Ser: 1.02 mg/dL (ref 0.50–1.35)
Glucose, Bld: 145 mg/dL — ABNORMAL HIGH (ref 70–99)
POTASSIUM: 3.6 mmol/L (ref 3.5–5.1)
Sodium: 138 mmol/L (ref 135–145)

## 2015-02-07 LAB — CBC
HCT: 44.1 % (ref 39.0–52.0)
HEMOGLOBIN: 15 g/dL (ref 13.0–17.0)
MCH: 27.7 pg (ref 26.0–34.0)
MCHC: 34 g/dL (ref 30.0–36.0)
MCV: 81.5 fL (ref 78.0–100.0)
Platelets: 143 10*3/uL — ABNORMAL LOW (ref 150–400)
RBC: 5.41 MIL/uL (ref 4.22–5.81)
RDW: 13.3 % (ref 11.5–15.5)
WBC: 16.1 10*3/uL — ABNORMAL HIGH (ref 4.0–10.5)

## 2015-02-07 LAB — RAPID URINE DRUG SCREEN, HOSP PERFORMED
Amphetamines: NOT DETECTED
Barbiturates: NOT DETECTED
Benzodiazepines: NOT DETECTED
COCAINE: NOT DETECTED
OPIATES: POSITIVE — AB
Tetrahydrocannabinol: NOT DETECTED

## 2015-02-07 LAB — LIPASE, BLOOD: Lipase: 1059 U/L — ABNORMAL HIGH (ref 11–59)

## 2015-02-07 MED ORDER — POTASSIUM CHLORIDE CRYS ER 20 MEQ PO TBCR
40.0000 meq | EXTENDED_RELEASE_TABLET | Freq: Once | ORAL | Status: AC
  Administered 2015-02-07: 40 meq via ORAL
  Filled 2015-02-07: qty 2

## 2015-02-07 MED ORDER — SODIUM CHLORIDE 0.9 % IV BOLUS (SEPSIS)
1000.0000 mL | Freq: Once | INTRAVENOUS | Status: AC
  Administered 2015-02-07: 1000 mL via INTRAVENOUS

## 2015-02-07 NOTE — Progress Notes (Signed)
Patient Demographics  Richard [name], is a 39 y.o. male, DOB - 02/06/1976, ZOX:[id number]  Admit date - 02/06/2015   Admitting Physician Eduard ClosArshad N Kakrakandy, MD  Outpatient Primary MD for the patient is Richard ShaggyEnobong, Amao, MD  LOS - 1   Chief Complaint  Patient presents with  . Abdominal Pain      Admission history of present illness: Richard [name] is a 39 y.o. male with history of alcoholism, schizophrenia, hypertension, hepatitis B presents to the ER because of abdominal pain with nausea vomiting. States he drinks beer everyday. recently admitted for alcoholic pancreatitis. patient's labs reveal elevated lipase and CT abdomen and pelvis is consistent with pancreatitis. Denies any blood in the vomitus. Denies any diarrhea. Denies any chest pain or shortness of breath or any fever chills. Patient's blood pressure was also found to be elevated and patient states he has not been taking his antihypertensives. A shunt was admitted for acute pancreatitis, started on IV fluids, when necessary pain medication.  Subjective:   Richard CrookJames Mascaro today has, No headache, No chest pain,No new weakness tingling or numbness, No Cough - SOB. still having complaints of abdominal pain, nausea, but no vomiting   Assessment & Plan    Principal Problem:   Acute alcoholic pancreatitis Active Problems:   Schizophrenic catatonia   Essential hypertension   Hepatitis B   Alcohol abuse   Acute pancreatitis   recurrent acute alcoholic pancreatitis -  continue with IV hydration, will increase fluids from 100 to 150 mL/h , keep on when necessary nausea and pain medication ,  - still complaining of abdominal pain nausea, so we'll keep nothing by mouth . - Counseled about alcohol abuse  Leukocytosis - Most likely is induced from pancreatitis, CT abdomen pelvis doesn't show any evidence of infection,  urineanalysis is negative, will monitor closely.  Alcohol abuse - Counseled - On CIWA  protocol   Hypertension - Elevated, on when necessary hydralazine.  Schizophrenia - On intramuscular haloperidol monthly  History of hepatitis B - Outpatient follow-up  Tobacco abuse - Nicotine patch  Code Status: Full  Family Communication: None at bedside  Disposition Plan: Home when stable   Procedures  None   Consults   None   Medications  Scheduled Meds: . enoxaparin (LOVENOX) injection  40 mg Subcutaneous QHS  . folic acid  1 mg Oral Daily  . [START ON 02/13/2015] haloperidol decanoate  50 mg Intramuscular Q28 days  . LORazepam  0-4 mg Intravenous 4 times per day   Followed by  . [START ON 02/09/2015] LORazepam  0-4 mg Intravenous Q12H  . multivitamin with minerals  1 tablet Oral Daily  . thiamine  100 mg Oral Daily   Or  . thiamine  100 mg Intravenous Daily   Continuous Infusions: . sodium chloride 150 mL/hr at 02/07/15 0829   PRN Meds:.acetaminophen **OR** acetaminophen, hydrALAZINE, HYDROmorphone (DILAUDID) injection, LORazepam **OR** LORazepam, ondansetron **OR** ondansetron (ZOFRAN) IV  DVT Prophylaxis  Lovenox  Lab Results  Component Value Date   PLT 143* 02/07/2015    Antibiotics    Anti-infectives    None          Objective:   Filed Vitals:   02/06/15 2055 02/06/15 2315 02/07/15 0259 02/07/15 0500  BP: 164/101 158/97 154/90 157/90  Pulse: 83 87  91  Temp: 97.6 F (36.4 C) 99.7 F (37.6 C)  100.3 F (37.9 C)  TempSrc: Oral Oral  Oral  Resp: Height:   (1.702 m)    Weight:  75.2 kg (165 lb 12.6 oz)    SpO2: 97% 99%  98%    Wt Readings from Last 3 Encounters:  02/06/15 75.2 kg (165 lb 12.6 oz)  12/25/14 76.204 kg (168 lb)     Intake/Output Summary (Last 24 hours) at 02/07/15 1253 Last data filed at 02/07/15 0500  Gross per 24 hour  Intake      0 ml  Output    600 ml  Net   -600 ml     Physical Exam  Awake  Alert, Oriented X 3, No new F.N deficits, Normal affect [place].AT,PERRAL Supple Neck,No JVD, No cervical lymphadenopathy appriciated.  Symmetrical Chest wall movement, Good air movement bilaterally, CTAB RRR,No Gallops,Rubs or new Murmurs, No Parasternal Heave +ve B.Sounds, Abd Soft,  mild tenderness in epigastric area. No organomegaly appriciated, No rebound - guarding or rigidity. No Cyanosis, Clubbing or edema, No new Rash or bruise    Data Review   Micro Results No results found for this or any previous visit (from the past 240 hour(s)).  Radiology Reports Ct Abdomen Pelvis W Contrast  02/06/2015   CLINICAL DATA:  Mid abdominal pain for 2 days. Possible history of previous liver surgery.  EXAM: CT ABDOMEN AND PELVIS WITH CONTRAST  TECHNIQUE: Multidetector CT imaging of the abdomen and pelvis was performed using the standard protocol following bolus administration of intravenous contrast.  CONTRAST:  OMNIPAQUE IOHEXOL 300 MG/ML SOLN, 50mL OMNIPAQUE IOHEXOL 300 MG/ML SOLN  COMPARISON:  09/27/2014  FINDINGS: There are inflammatory changes surrounding the pancreas. Pancreatic margins are ill-defined. Pancreas shows homogeneous enhancement. No pancreatic mass. Fluid attenuation extends from the pancreas along the anterior para renal fascia bilaterally. A small amount of fluid collects in the posterior pelvic recess and right lower quadrant.  There is normal enhancement of the splenic vein, portal vein and superior mesenteric vein.  There is fatty infiltration of the liver. No liver mass or focal lesion.  Gallbladder is unremarkable.  No bile duct dilation.  Normal spleen. No adrenal masses. Normal kidneys, ureters and bladder.  No adenopathy.  Normal bowel.  Normal appendix.  No significant bony abnormality.  Clear lung bases.  IMPRESSION: 1. Acute uncomplicated pancreatitis. No evidence of pancreatic necrosis, venous thrombosis or pseudocyst or abscess formation. 2. No other acute findings. 3.  Hepatic steatosis.   Electronically Signed   By: Amie Portland M.D.   On: 02/06/2015 20:58    CBC  Recent Labs Lab 02/06/15 1659 02/07/15 0533  WBC 13.3* 16.1*  HGB 15.5 15.0  HCT 45.9 44.1  PLT 162 143*  MCV 81.2 81.5  MCH 27.4 27.7  MCHC 33.8 34.0  RDW 12.9 13.3  LYMPHSABS 0.9  --   MONOABS 1.0  --   EOSABS 0.0  --   BASOSABS 0.0  --     Chemistries   Recent Labs Lab 02/06/15 1659 02/07/15 0533  NA 140 138  K 4.4 3.6  CL 103 104  CO2 26 24  GLUCOSE 144* 145*  BUN 12 8  CREATININE 1.10 1.02  CALCIUM 9.5 8.5  AST 27  --   ALT 33  --   ALKPHOS 71  --   BILITOT 1.4*  --    ------------------------------------------------------------------------------------------------------------------  estimated creatinine clearance is 92.7 mL/min (by C-G formula based on Cr of 1.02). ------------------------------------------------------------------------------------------------------------------ No results for input(s): HGBA1C in the last 72 hours. ------------------------------------------------------------------------------------------------------------------ No results for input(s): CHOL, HDL, LDLCALC, TRIG, CHOLHDL, LDLDIRECT in the last 72 hours. ------------------------------------------------------------------------------------------------------------------ No results for input(s): TSH, T4TOTAL, T3FREE, THYROIDAB in the last 72 hours.  Invalid input(s): FREET3 ------------------------------------------------------------------------------------------------------------------ No results for input(s): VITAMINB12, FOLATE, FERRITIN, TIBC, IRON, RETICCTPCT in the last 72 hours.  Coagulation profile No results for input(s): INR, PROTIME in the last 168 hours.  No results for input(s): DDIMER in the last 72 hours.  Cardiac Enzymes No results for input(s): CKMB, TROPONINI, MYOGLOBIN in the last 168 hours.  Invalid input(s):  CK ------------------------------------------------------------------------------------------------------------------ Invalid input(s): POCBNP     Time Spent in minutes   30 minutes   Koi Zangara M.D on 02/07/2015 at 12:53 PM  Between 7am to 7pm - Pager - [phone]  After 7pm go to [email or web address] - password [email or web address]  And look for the night coverage person covering for me after hours  [facility] Office  [phone]   **Disclaimer: This note may have been dictated with voice recognition software. Similar sounding words can inadvertently be transcribed and this note may contain transcription errors which may not have been corrected upon publication of note.**

## 2015-02-08 LAB — CBC
HCT: 41.1 % (ref 39.0–52.0)
Hemoglobin: 13.2 g/dL (ref 13.0–17.0)
MCH: 26.8 pg (ref 26.0–34.0)
MCHC: 32.1 g/dL (ref 30.0–36.0)
MCV: 83.5 fL (ref 78.0–100.0)
Platelets: 126 10*3/uL — ABNORMAL LOW (ref 150–400)
RBC: 4.92 MIL/uL (ref 4.22–5.81)
RDW: 13.3 % (ref 11.5–15.5)
WBC: 14.3 10*3/uL — ABNORMAL HIGH (ref 4.0–10.5)

## 2015-02-08 LAB — BASIC METABOLIC PANEL
Anion gap: 6 (ref 5–15)
BUN: 9 mg/dL (ref 6–23)
CHLORIDE: 108 mmol/L (ref 96–112)
CO2: 25 mmol/L (ref 19–32)
Calcium: 8 mg/dL — ABNORMAL LOW (ref 8.4–10.5)
Creatinine, Ser: 1.07 mg/dL (ref 0.50–1.35)
GFR calc non Af Amer: 87 mL/min — ABNORMAL LOW (ref >90)
Glucose, Bld: 110 mg/dL — ABNORMAL HIGH (ref 70–99)
Potassium: 3.7 mmol/L (ref 3.5–5.1)
Sodium: 139 mmol/L (ref 135–145)

## 2015-02-08 LAB — GLUCOSE, CAPILLARY
GLUCOSE-CAPILLARY: 105 mg/dL — AB (ref 70–99)
GLUCOSE-CAPILLARY: 109 mg/dL — AB (ref 70–99)
Glucose-Capillary: 115 mg/dL — ABNORMAL HIGH (ref 70–99)
Glucose-Capillary: 108 mg/dL — ABNORMAL HIGH (ref 70–99)
Glucose-Capillary: 114 mg/dL — ABNORMAL HIGH (ref 70–99)

## 2015-02-08 LAB — LIPASE, BLOOD
LIPASE: 191 U/L — AB (ref 11–59)
LIPASE: 137 U/L — AB (ref 11–59)

## 2015-02-08 MED ORDER — SODIUM CHLORIDE 0.9 % IV SOLN
INTRAVENOUS | Status: AC
  Administered 2015-02-08 – 2015-02-09 (×3): via INTRAVENOUS

## 2015-02-08 MED ORDER — SODIUM CHLORIDE 0.9 % IV BOLUS (SEPSIS)
1000.0000 mL | Freq: Once | INTRAVENOUS | Status: AC
  Administered 2015-02-08: 1000 mL via INTRAVENOUS

## 2015-02-08 NOTE — Progress Notes (Signed)
Patient Demographics  Richard [name], is a 39 y.o. male, DOB - 05/06/1976, [id number]  Admit date - 02/06/2015   Admitting Physician Eduard ClosArshad N Kakrakandy, MD  Outpatient Primary MD for the patient is Jaclyn ShaggyEnobong, Amao, MD  LOS - 2   Chief Complaint  Patient presents with  . Abdominal Pain      Admission history of present illness: Richard [name] is a [age] y.o. male with history of alcoholism, schizophrenia, hypertension, hepatitis B presents to the ER because of abdominal pain with nausea vomiting. States he drinks beer everyday. recently admitted for alcoholic pancreatitis. patient's labs reveal elevated lipase and CT abdomen and pelvis is consistent with pancreatitis. Denies any blood in the vomitus. Denies any diarrhea. Denies any chest pain or shortness of breath or any fever chills. Patient's blood pressure was also found to be elevated and patient states he has not been taking his antihypertensives. A shunt was admitted for acute pancreatitis, started on IV fluids, when necessary pain medication.  Subjective:   Richard [name] today has, No headache, No chest pain,No new weakness tingling or numbness, No Cough - SOB. Reports abdominal pain is improving, nausea, but no vomiting ,  Requesting to be started on clear liquid.  Assessment & Plan    Principal Problem:   Acute alcoholic pancreatitis Active Problems:   Schizophrenic catatonia   Essential hypertension   Hepatitis B   Alcohol abuse   Acute pancreatitis   recurrent acute alcoholic pancreatitis -  continue with IV hydration 150 mL/h , keep on when necessary nausea and pain medication ,  - Mild nausea, abdominal pain improving, no vomiting, will start on clear liquid diet and advance as tolerated - Counseled about alcohol abuse  Leukocytosis - Most likely is induced from pancreatitis, CT abdomen pelvis doesn't  show any evidence of infection, urineanalysis is negative, will monitor closely. - Improving, will continue to monitor closely as having low-grade fever.  Alcohol abuse - Counseled - On CIWA  protocol   Hypertension - Susceptible, on when necessary hydralazine.  Schizophrenia - On intramuscular haloperidol monthly  History of hepatitis B - Outpatient follow-up  Tobacco abuse - Nicotine patch  Code Status: Full  Family Communication: None at bedside  Disposition Plan: Home when stable   Procedures  None   Consults   None   Medications  Scheduled Meds: . enoxaparin (LOVENOX) injection  40 mg Subcutaneous QHS  . folic acid  1 mg Oral Daily  . [START ON 02/13/2015] haloperidol decanoate  50 mg Intramuscular Q28 days  . LORazepam  0-4 mg Intravenous 4 times per day   Followed by  . [START ON 02/09/2015] LORazepam  0-4 mg Intravenous Q12H  . multivitamin with minerals  1 tablet Oral Daily  . thiamine  100 mg Oral Daily   Or  . thiamine  100 mg Intravenous Daily   Continuous Infusions: . sodium chloride 150 mL/hr at 02/08/15 1021   PRN Meds:.acetaminophen **OR** acetaminophen, hydrALAZINE, HYDROmorphone (DILAUDID) injection, LORazepam **OR** LORazepam, ondansetron **OR** ondansetron (ZOFRAN) IV  DVT Prophylaxis  Lovenox  Lab Results  Component Value Date   PLT 126* 02/08/2015    Antibiotics    Anti-infectives    None  Objective:   Filed Vitals:   02/07/15 1806 02/07/15 2115 02/08/15 0543 02/08/15 1438  BP: 146/95 145/88 123/78 129/77  Pulse: 81 82 92 78  Temp: 99.6 F (37.6 C) 100.3 F (37.9 C) 100 F (37.8 C) 100.6 F (38.1 C)  TempSrc: Oral Oral Oral Oral  Resp: Height:      Weight:      SpO2: 98% 98% 99% 99%    Wt Readings from Last 3 Encounters:  02/06/15 75.2 kg (165 lb 12.6 oz)  12/25/14 76.204 kg (168 lb)     Intake/Output Summary (Last 24 hours) at 02/08/15 1556 Last data filed at 02/08/15 1539  Gross  per 24 hour  Intake 2675.83 ml  Output    950 ml  Net 1725.83 ml     Physical Exam  Awake Alert, Oriented X 3, No new F.N deficits, Normal affect [place].AT,PERRAL Supple Neck,No JVD, No cervical lymphadenopathy appriciated.  Symmetrical Chest wall movement, Good air movement bilaterally, CTAB RRR,No Gallops,Rubs or new Murmurs, No Parasternal Heave +ve B.Sounds, Abd Soft,  mild tenderness in epigastric area. No organomegaly appriciated, No rebound - guarding or rigidity. No Cyanosis, Clubbing or edema, No new Rash or bruise    Data Review   Micro Results No results found for this or any previous visit (from the past 240 hour(s)).  Radiology Reports Ct Abdomen Pelvis W Contrast  02/06/2015   CLINICAL DATA:  Mid abdominal pain for 2 days. Possible history of previous liver surgery.  EXAM: CT ABDOMEN AND PELVIS WITH CONTRAST  TECHNIQUE: Multidetector CT imaging of the abdomen and pelvis was performed using the standard protocol following bolus administration of intravenous contrast.  CONTRAST:  OMNIPAQUE IOHEXOL 300 MG/ML SOLN, 50mL OMNIPAQUE IOHEXOL 300 MG/ML SOLN  COMPARISON:  09/27/2014  FINDINGS: There are inflammatory changes surrounding the pancreas. Pancreatic margins are ill-defined. Pancreas shows homogeneous enhancement. No pancreatic mass. Fluid attenuation extends from the pancreas along the anterior para renal fascia bilaterally. A small amount of fluid collects in the posterior pelvic recess and right lower quadrant.  There is normal enhancement of the splenic vein, portal vein and superior mesenteric vein.  There is fatty infiltration of the liver. No liver mass or focal lesion.  Gallbladder is unremarkable.  No bile duct dilation.  Normal spleen. No adrenal masses. Normal kidneys, ureters and bladder.  No adenopathy.  Normal bowel.  Normal appendix.  No significant bony abnormality.  Clear lung bases.  IMPRESSION: 1. Acute uncomplicated pancreatitis. No evidence of pancreatic  necrosis, venous thrombosis or pseudocyst or abscess formation. 2. No other acute findings. 3. Hepatic steatosis.   Electronically Signed   By: Amie Portland M.D.   On: 02/06/2015 20:58    CBC  Recent Labs Lab 02/06/15 1659 02/07/15 0533 02/08/15 0546  WBC 13.3* 16.1* 14.3*  HGB 15.5 15.0 13.2  HCT 45.9 44.1 41.1  PLT 162 143* 126*  MCV 81.2 81.5 83.5  MCH 27.4 27.7 26.8  MCHC 33.8 34.0 32.1  RDW 12.9 13.3 13.3  LYMPHSABS 0.9  --   --   MONOABS 1.0  --   --   EOSABS 0.0  --   --   BASOSABS 0.0  --   --     Chemistries   Recent Labs Lab 02/06/15 1659 02/07/15 0533 02/08/15 0546  NA 140 138 139  K 4.4 3.6 3.7  CL 103 104 108  CO2 GLUCOSE 144* 145* 110*  BUN 12  8 9  CREATININE 1.10 1.02 1.07  CALCIUM 9.5 8.5 8.0*  AST 27  --   --   ALT 33  --   --   ALKPHOS 71  --   --   BILITOT 1.4*  --   --    ------------------------------------------------------------------------------------------------------------------ estimated creatinine clearance is 88.4 mL/min (by C-G formula based on Cr of 1.07). ------------------------------------------------------------------------------------------------------------------ No results for input(s): HGBA1C in the last 72 hours. ------------------------------------------------------------------------------------------------------------------ No results for input(s): CHOL, HDL, LDLCALC, TRIG, CHOLHDL, LDLDIRECT in the last 72 hours. ------------------------------------------------------------------------------------------------------------------ No results for input(s): TSH, T4TOTAL, T3FREE, THYROIDAB in the last 72 hours.  Invalid input(s): FREET3 ------------------------------------------------------------------------------------------------------------------ No results for input(s): VITAMINB12, FOLATE, FERRITIN, TIBC, IRON, RETICCTPCT in the last 72 hours.  Coagulation profile No results for input(s): INR, PROTIME in the  last 168 hours.  No results for input(s): DDIMER in the last 72 hours.  Cardiac Enzymes No results for input(s): CKMB, TROPONINI, MYOGLOBIN in the last 168 hours.  Invalid input(s): CK ------------------------------------------------------------------------------------------------------------------ Invalid input(s): POCBNP     Time Spent in minutes   30 minutes   Quynn Vilchis M.D on 02/08/2015 at 3:56 PM  Between 7am to 7pm - Pager - [phone]  After 7pm go to [email or web address] - password [email or web address]  And look for the night coverage person covering for me after hours  [facility] Office  [phone]   **Disclaimer: This note may have been dictated with voice recognition software. Similar sounding words can inadvertently be transcribed and this note may contain transcription errors which may not have been corrected upon publication of note.**

## 2015-02-09 ENCOUNTER — Inpatient Hospital Stay (HOSPITAL_COMMUNITY): Payer: Self-pay

## 2015-02-09 LAB — GLUCOSE, CAPILLARY
GLUCOSE-CAPILLARY: 113 mg/dL — AB (ref 70–99)
GLUCOSE-CAPILLARY: 159 mg/dL — AB (ref 70–99)
Glucose-Capillary: 106 mg/dL — ABNORMAL HIGH (ref 70–99)
Glucose-Capillary: 123 mg/dL — ABNORMAL HIGH (ref 70–99)
Glucose-Capillary: 136 mg/dL — ABNORMAL HIGH (ref 70–99)
Glucose-Capillary: 95 mg/dL (ref 70–99)

## 2015-02-09 LAB — COMPREHENSIVE METABOLIC PANEL
ALT: 17 U/L (ref 0–53)
AST: 26 U/L (ref 0–37)
Albumin: 2.6 g/dL — ABNORMAL LOW (ref 3.5–5.2)
Alkaline Phosphatase: 51 U/L (ref 39–117)
Anion gap: 9 (ref 5–15)
BUN: 7 mg/dL (ref 6–23)
CO2: 25 mmol/L (ref 19–32)
Calcium: 8.1 mg/dL — ABNORMAL LOW (ref 8.4–10.5)
Chloride: 105 mmol/L (ref 96–112)
Creatinine, Ser: 0.95 mg/dL (ref 0.50–1.35)
GFR calc Af Amer: >90 mL/min (ref >90)
GFR calc non Af Amer: >90 mL/min (ref >90)
Glucose, Bld: 110 mg/dL — ABNORMAL HIGH (ref 70–99)
Potassium: 3.4 mmol/L — ABNORMAL LOW (ref 3.5–5.1)
SODIUM: 139 mmol/L (ref 135–145)
Total Bilirubin: 1.1 mg/dL (ref 0.3–1.2)
Total Protein: 6.3 g/dL (ref 6.0–8.3)

## 2015-02-09 LAB — CBC
HCT: 36.3 % — ABNORMAL LOW (ref 39.0–52.0)
Hemoglobin: 12.1 g/dL — ABNORMAL LOW (ref 13.0–17.0)
MCH: 27.6 pg (ref 26.0–34.0)
MCHC: 33.3 g/dL (ref 30.0–36.0)
MCV: 82.9 fL (ref 78.0–100.0)
PLATELETS: 129 10*3/uL — AB (ref 150–400)
RBC: 4.38 MIL/uL (ref 4.22–5.81)
RDW: 13.1 % (ref 11.5–15.5)
WBC: 9.9 10*3/uL (ref 4.0–10.5)

## 2015-02-09 MED ORDER — SODIUM CHLORIDE 0.9 % IV SOLN
INTRAVENOUS | Status: DC
  Administered 2015-02-09: 18:00:00 via INTRAVENOUS

## 2015-02-09 MED ORDER — METOCLOPRAMIDE HCL 10 MG PO TABS
10.0000 mg | ORAL_TABLET | Freq: Three times a day (TID) | ORAL | Status: DC
  Administered 2015-02-10 (×2): 10 mg via ORAL
  Filled 2015-02-09 (×4): qty 1

## 2015-02-09 MED ORDER — OXYCODONE HCL 5 MG PO TABS
5.0000 mg | ORAL_TABLET | ORAL | Status: DC | PRN
  Administered 2015-02-09 – 2015-02-10 (×3): 5 mg via ORAL
  Filled 2015-02-09 (×3): qty 1

## 2015-02-09 MED ORDER — POLYETHYLENE GLYCOL 3350 17 G PO PACK
17.0000 g | PACK | Freq: Two times a day (BID) | ORAL | Status: DC
  Administered 2015-02-09 – 2015-02-10 (×3): 17 g via ORAL
  Filled 2015-02-09 (×4): qty 1

## 2015-02-09 MED ORDER — HYDROMORPHONE HCL 1 MG/ML IJ SOLN: 0.5000 mg | Freq: Four times a day (QID) | INTRAMUSCULAR | Status: DC | PRN

## 2015-02-09 MED ORDER — BISACODYL 5 MG PO TBEC
5.0000 mg | DELAYED_RELEASE_TABLET | Freq: Two times a day (BID) | ORAL | Status: AC
  Administered 2015-02-09 (×2): 5 mg via ORAL
  Filled 2015-02-09 (×2): qty 1

## 2015-02-09 NOTE — Progress Notes (Addendum)
Patient Demographics  Richard [name], is a 39 y.o. male, DOB - 09/17/1976, ZOX:[id number]  Admit date - 02/06/2015   Admitting Physician Eduard ClosArshad N Kakrakandy, MD  Outpatient Primary MD for the patient is Jaclyn ShaggyEnobong, Amao, MD  LOS - 3   Chief Complaint  Patient presents with  . Abdominal Pain      Admission history of present illness: Richard [name] is a 39 y.o. male with history of alcoholism, schizophrenia, hypertension, hepatitis B presents to the ER because of abdominal pain with nausea vomiting. States he drinks beer everyday. recently admitted for alcoholic pancreatitis. patient's labs reveal elevated lipase and CT abdomen and pelvis is consistent with pancreatitis. Denies any blood in the vomitus. Denies any diarrhea. Denies any chest pain or shortness of breath or any fever chills. Patient's blood pressure was also found to be elevated and patient states he has not been taking his antihypertensives. A shunt was admitted for acute pancreatitis, started on IV fluids, when necessary pain medication.  Subjective:   Richard [name] today has, No headache, No chest pain,No new weakness tingling or numbness, No Cough - SOB. Reports abdominal pain is improving, still complains of nausea after drinking liquids, but no vomiting , patient reports constipation, no bowel movement since admission Had fever of 100.6 yesterday evening.  Assessment & Plan    Principal Problem:   Acute alcoholic pancreatitis Active Problems:   Schizophrenic catatonia   Essential hypertension   Hepatitis B   Alcohol abuse   Acute pancreatitis   recurrent acute alcoholic pancreatitis -  continue with IV hydration 75 mL/h , keep on when necessary nausea and pain medication ,  - Reports nausea after clear liquid diet started, will continue with clear liquid diet today as abdominal x-ray showing evidence  of ileus. - Lipase level significantly improved. - Counseled about alcohol abuse  Leukocytosis - Most likely is induced from pancreatitis, CT abdomen pelvis doesn't show any evidence of infection, urineanalysis is negative, will monitor closely. - Resolved, continue to monitor closely with temperature of 100.6, no indication for antibiotics at this point. Will check blood cultures  Ileus - KUB showing evidence of ileus today, will start on Reglan, good bowel regimen as he reports constipation, keep on clear liquid diet - patient encouraged to ambulate  Alcohol abuse - Counseled - On CIWA  protocol   Hypertension - Susceptible, on when necessary hydralazine.  Schizophrenia - On intramuscular haloperidol monthly  History of hepatitis B - Outpatient follow-up  Tobacco abuse - Nicotine patch  Code Status: Full  Family Communication: None at bedside  Disposition Plan: Home when stable   Procedures  None   Consults   None   Medications  Scheduled Meds: . bisacodyl  5 mg Oral BID  . enoxaparin (LOVENOX) injection  40 mg Subcutaneous QHS  . folic acid  1 mg Oral Daily  . [START ON 02/13/2015] haloperidol decanoate  50 mg Intramuscular Q28 days  . LORazepam  0-4 mg Intravenous Q12H  . metoCLOPramide  10 mg Oral TID AC  . multivitamin with minerals  1 tablet Oral Daily  . polyethylene glycol  17 g Oral BID  . thiamine  100 mg Oral Daily   Or  . thiamine  100 mg  Intravenous Daily   Continuous Infusions: . sodium chloride 100 mL/hr at 02/09/15 1209   PRN Meds:.acetaminophen **OR** acetaminophen, hydrALAZINE, HYDROmorphone (DILAUDID) injection, LORazepam **OR** LORazepam, ondansetron **OR** ondansetron (ZOFRAN) IV, oxyCODONE  DVT Prophylaxis  Lovenox  Lab Results  Component Value Date   PLT 129* 02/09/2015    Antibiotics    Anti-infectives    None          Objective:   Filed Vitals:   02/08/15 2104 02/09/15 0006 02/09/15 0549 02/09/15 1225  BP:  133/89 136/84 137/80 134/81  Pulse: 77 84 71 72  Temp: 99.1 F (37.3 C) 100.4 F (38 C) 99.3 F (37.4 C) 98 F (36.7 C)  TempSrc: Oral Oral Oral Oral  Resp: Height:      Weight:      SpO2: 99% 95% 98% 98%    Wt Readings from Last 3 Encounters:  02/06/15 75.2 kg (165 lb 12.6 oz)  12/25/14 76.204 kg (168 lb)     Intake/Output Summary (Last 24 hours) at 02/09/15 1328 Last data filed at 02/09/15 1226  Gross per 24 hour  Intake 3367.5 ml  Output   2500 ml  Net  867.5 ml     Physical Exam  Awake Alert, Oriented X 3, No new F.N deficits, Normal affect [place].AT,PERRAL Supple Neck,No JVD, No cervical lymphadenopathy appriciated.  Symmetrical Chest wall movement, Good air movement bilaterally, CTAB RRR,No Gallops,Rubs or new Murmurs, No Parasternal Heave +ve B.Sounds, Abd Soft, nontender. No organomegaly appriciated, No rebound - guarding or rigidity. No Cyanosis, Clubbing or edema, No new Rash or bruise    Data Review   Micro Results No results found for this or any previous visit (from the past 240 hour(s)).  Radiology Reports Dg Abd 1 View  02/09/2015   CLINICAL DATA:  Mid abdominal pain and constipation. History of pancreatitis.  EXAM: ABDOMEN - 1 VIEW  COMPARISON:  CT abdomen pelvis - 01/17/2015  FINDINGS: There is mild gas distention of the colon and several loops of small bowel.  Nondiagnostic evaluation pneumoperitoneum secondary to supine positioning and exclusion of the lower thorax. No definite pneumatosis or portal venous gas.  No definite abnormal intra-abdominal calcifications.  No acute osseus abnormalities.  IMPRESSION: Findings suggestive of ileus.   Electronically Signed   By: Simonne Come M.D.   On: 02/09/2015 12:25    CBC  Recent Labs Lab 02/06/15 1659 02/07/15 0533 02/08/15 0546 02/09/15 0829  WBC 13.3* 16.1* 14.3* 9.9  HGB 15.5 15.0 13.2 12.1*  HCT 45.9 44.1 41.1 36.3*  PLT 162 143* 126* 129*  MCV 81.2 81.5 83.5 82.9  MCH 27.4 27.7  26.8 27.6  MCHC 33.8 34.0 32.1 33.3  RDW 12.9 13.3 13.3 13.1  LYMPHSABS 0.9  --   --   --   MONOABS 1.0  --   --   --   EOSABS 0.0  --   --   --   BASOSABS 0.0  --   --   --     Chemistries   Recent Labs Lab 02/06/15 1659 02/07/15 0533 02/08/15 0546 02/09/15 0555  NA 140 138 139 139  K 4.4 3.6 3.7 3.4*  CL 103 104 108 105  CO2 GLUCOSE 144* 145* 110* 110*  BUN CREATININE 1.10 1.02 1.07 0.95  CALCIUM 9.5 8.5 8.0* 8.1*  AST 27  --   --  26  ALT 33  --   --  17  ALKPHOS 71  --   --  51  BILITOT 1.4*  --   --  1.1   ------------------------------------------------------------------------------------------------------------------ estimated creatinine clearance is 99.5 mL/min (by C-G formula based on Cr of 0.95). ------------------------------------------------------------------------------------------------------------------ No results for input(s): HGBA1C in the last 72 hours. ------------------------------------------------------------------------------------------------------------------ No results for input(s): CHOL, HDL, LDLCALC, TRIG, CHOLHDL, LDLDIRECT in the last 72 hours. ------------------------------------------------------------------------------------------------------------------ No results for input(s): TSH, T4TOTAL, T3FREE, THYROIDAB in the last 72 hours.  Invalid input(s): FREET3 ------------------------------------------------------------------------------------------------------------------ No results for input(s): VITAMINB12, FOLATE, FERRITIN, TIBC, IRON, RETICCTPCT in the last 72 hours.  Coagulation profile No results for input(s): INR, PROTIME in the last 168 hours.  No results for input(s): DDIMER in the last 72 hours.  Cardiac Enzymes No results for input(s): CKMB, TROPONINI, MYOGLOBIN in the last 168 hours.  Invalid input(s):  CK ------------------------------------------------------------------------------------------------------------------ Invalid input(s): POCBNP     Time Spent in minutes   25 minutes   Joab Carden M.D on 02/09/2015 at 1:28 PM  Between 7am to 7pm - Pager - [phone]  After 7pm go to [email or web address] - password [email or web address]  And look for the night coverage person covering for me after hours  [facility] Office  [phone]   **Disclaimer: This note may have been dictated with voice recognition software. Similar sounding words can inadvertently be transcribed and this note may contain transcription errors which may not have been corrected upon publication of note.**

## 2015-02-10 ENCOUNTER — Inpatient Hospital Stay (HOSPITAL_COMMUNITY): Payer: Self-pay

## 2015-02-10 LAB — GLUCOSE, CAPILLARY
GLUCOSE-CAPILLARY: 105 mg/dL — AB (ref 70–99)
Glucose-Capillary: 105 mg/dL — ABNORMAL HIGH (ref 70–99)
Glucose-Capillary: 112 mg/dL — ABNORMAL HIGH (ref 70–99)
Glucose-Capillary: 96 mg/dL (ref 70–99)

## 2015-02-10 LAB — COMPREHENSIVE METABOLIC PANEL
ALBUMIN: 2.8 g/dL — AB (ref 3.5–5.0)
ALT: 41 U/L (ref 17–63)
AST: 67 U/L — AB (ref 15–41)
Alkaline Phosphatase: 69 U/L (ref 38–126)
Anion gap: 7 (ref 5–15)
BUN: <5 mg/dL — ABNORMAL LOW (ref 6–20)
CALCIUM: 8.4 mg/dL — AB (ref 8.9–10.3)
CO2: 26 mmol/L (ref 22–32)
CREATININE: 0.8 mg/dL (ref 0.61–1.24)
Chloride: 106 mmol/L (ref 101–111)
GFR calc Af Amer: >60 mL/min (ref >60)
GFR calc non Af Amer: >60 mL/min (ref >60)
Glucose, Bld: 121 mg/dL — ABNORMAL HIGH (ref 70–99)
Potassium: 3.3 mmol/L — ABNORMAL LOW (ref 3.5–5.1)
SODIUM: 139 mmol/L (ref 135–145)
TOTAL PROTEIN: 6.3 g/dL — AB (ref 6.5–8.1)
Total Bilirubin: 1.2 mg/dL (ref 0.3–1.2)

## 2015-02-10 LAB — CBC
HEMATOCRIT: 35.7 % — AB (ref 39.0–52.0)
Hemoglobin: 12 g/dL — ABNORMAL LOW (ref 13.0–17.0)
MCH: 27.3 pg (ref 26.0–34.0)
MCHC: 33.6 g/dL (ref 30.0–36.0)
MCV: 81.3 fL (ref 78.0–100.0)
PLATELETS: 162 10*3/uL (ref 150–400)
RBC: 4.39 MIL/uL (ref 4.22–5.81)
RDW: 12.9 % (ref 11.5–15.5)
WBC: 6.8 10*3/uL (ref 4.0–10.5)

## 2015-02-10 LAB — LIPASE, BLOOD: Lipase: 127 U/L — ABNORMAL HIGH (ref 22–51)

## 2015-02-10 MED ORDER — ADULT MULTIVITAMIN W/MINERALS CH: 1.0000 | ORAL_TABLET | Freq: Every day | ORAL | Status: AC

## 2015-02-10 MED ORDER — POTASSIUM CHLORIDE CRYS ER 20 MEQ PO TBCR
40.0000 meq | EXTENDED_RELEASE_TABLET | Freq: Once | ORAL | Status: AC
  Administered 2015-02-10: 40 meq via ORAL
  Filled 2015-02-10: qty 2

## 2015-02-10 MED ORDER — THIAMINE HCL 100 MG PO TABS: 100.0000 mg | ORAL_TABLET | Freq: Every day | ORAL | Status: AC

## 2015-02-10 MED ORDER — FOLIC ACID 1 MG PO TABS: 1.0000 mg | ORAL_TABLET | Freq: Every day | ORAL | Status: AC

## 2015-02-10 NOTE — Progress Notes (Signed)
Ambulated with patient in hallway on this unit as well as [address] patient tolerated well with standby assistance only.

## 2015-02-10 NOTE — Discharge Summary (Signed)
Richard [name], 39 y.o., DOB [birth year], MRN [id number]. Admission date: 02/06/2015 Discharge Date 02/10/2015 Primary MD [name], MD Admitting Physician Eduard Clos, MD   PCP please follow-up on: - Check CBC, CMP, lipase little during next visit.  Admission Diagnosis  Generalized abdominal pain [R10.84] Alcohol-induced acute pancreatitis [K85.2] Non-intractable vomiting with nausea, vomiting of unspecified type [R11.2]  Discharge Diagnosis   Principal Problem:   Acute alcoholic pancreatitis Active Problems:   Schizophrenic catatonia   Essential hypertension   Hepatitis B   Alcohol abuse   Acute pancreatitis   Past Medical History  Diagnosis Date  . Hypertension   . PTSD (post-traumatic stress disorder)   . Pancreatitis   . Catatonia schizophrenia     Past Surgical History  Procedure Laterality Date  . Liver biospy  2008     Hospital Course See H&P, Labs, Consult and Test reports for all details in brief, patient was admitted for **  Principal Problem:   Acute alcoholic pancreatitis Active Problems:   Schizophrenic catatonia   Essential hypertension   Hepatitis B   Alcohol abuse   Acute pancreatitis  Admission history of present illness: Richard [name] is a 39 y.o. male with history of alcoholism, schizophrenia, hypertension, hepatitis B presents to the ER because of abdominal pain with nausea vomiting. States he drinks beer everyday. recently admitted for alcoholic pancreatitis. patient's labs reveal elevated lipase and CT abdomen and pelvis is consistent with pancreatitis. Denies any blood in the vomitus. Denies any diarrhea. Denies any chest pain or shortness of breath or any fever chills. Patient's blood pressure was also found to be elevated and patient states he has not been taking his antihypertensives. Patient was admitted for acute pancreatitis, patient was started on IV fluids, when necessary pain medication. Luminal pain improved, continues to  complain of nausea, abdominal x-ray showing moderate ileus, improved after Reglan and good laxative regimen, with oral intake, no nausea, no vomiting, no evidence or signs of withdrawals during hospital stay.  recurrent acute alcoholic pancreatitis - Was on IV fluids, IV pain and nausea medication, started on clear liquid diet, advanced to soft diet with no complaints. - Lipase level significantly improved. Lipase was 1362 on admission, 127 on discharge - Counseled about alcohol abuse  Leukocytosis - Most likely is induced from pancreatitis, CT abdomen pelvis doesn't show any evidence of infection, urineanalysis is negative, - Resolved, no fever over the last 24 hours. Blood cultures no growth at time of discharge  Ileus - KUB showing evidence of ileus, clinically resolved, tolerating diet, has good bowel movement, and good bowel sounds.  Alcohol abuse - Counseled, will discharged on thiamine and folic acid, no evidence of withdrawals during hospital stay.  Hypertension - Acceptable  Schizophrenia - On intramuscular haloperidol monthly  History of hepatitis B - Outpatient follow-up  Tobacco abuse - Nicotine patch   Consults   None  Significant Tests:  See full reports for all details    Dg Abd 1 View  02/10/2015   CLINICAL DATA:  Nausea and vomiting. Acute pancreatitis. Chronic hepatitis-B.  EXAM: ABDOMEN - 1 VIEW  COMPARISON:  02/09/2015  FINDINGS: Mild gaseous distention of both small bowel and colon is seen, which is not significant changed compared to previous study. This is suspicious for a mild ileus. No radiopaque calculi or other significant radiographic abnormality identified.  IMPRESSION: Mild ileus pattern, without significant interval change.   Electronically Signed   By: Myles Rosenthal M.D.   On: 02/10/2015 13:53  Dg Abd 1 View  02/09/2015   CLINICAL DATA:  Mid abdominal pain and constipation. History of pancreatitis.  EXAM: ABDOMEN - 1 VIEW  COMPARISON:  CT abdomen  pelvis - 01/17/2015  FINDINGS: There is mild gas distention of the colon and several loops of small bowel.  Nondiagnostic evaluation pneumoperitoneum secondary to supine positioning and exclusion of the lower thorax. No definite pneumatosis or portal venous gas.  No definite abnormal intra-abdominal calcifications.  No acute osseus abnormalities.  IMPRESSION: Findings suggestive of ileus.   Electronically Signed   By: Simonne Come M.D.   On: 02/09/2015 12:25   Ct Abdomen Pelvis W Contrast  02/06/2015   CLINICAL DATA:  Mid abdominal pain for 2 days. Possible history of previous liver surgery.  EXAM: CT ABDOMEN AND PELVIS WITH CONTRAST  TECHNIQUE: Multidetector CT imaging of the abdomen and pelvis was performed using the standard protocol following bolus administration of intravenous contrast.  CONTRAST:  OMNIPAQUE IOHEXOL 300 MG/ML SOLN, 50mL OMNIPAQUE IOHEXOL 300 MG/ML SOLN  COMPARISON:  09/27/2014  FINDINGS: There are inflammatory changes surrounding the pancreas. Pancreatic margins are ill-defined. Pancreas shows homogeneous enhancement. No pancreatic mass. Fluid attenuation extends from the pancreas along the anterior para renal fascia bilaterally. A small amount of fluid collects in the posterior pelvic recess and right lower quadrant.  There is normal enhancement of the splenic vein, portal vein and superior mesenteric vein.  There is fatty infiltration of the liver. No liver mass or focal lesion.  Gallbladder is unremarkable.  No bile duct dilation.  Normal spleen. No adrenal masses. Normal kidneys, ureters and bladder.  No adenopathy.  Normal bowel.  Normal appendix.  No significant bony abnormality.  Clear lung bases.  IMPRESSION: 1. Acute uncomplicated pancreatitis. No evidence of pancreatic necrosis, venous thrombosis or pseudocyst or abscess formation. 2. No other acute findings. 3. Hepatic steatosis.   Electronically Signed   By: Amie Portland M.D.   On: 02/06/2015 20:58     Today    Subjective:   Richard [name] today has no headache,no chest abdominal pain,no new weakness tingling or numbness, feels much better, good appetite, good bowel movement this a.m..  Objective:   Blood pressure 145/94, pulse 80, temperature 98.8 F (37.1 C), temperature source Oral, resp. rate 20, height  (1.702 m), weight 75.2 kg (165 lb 12.6 oz), SpO2 99 %.  Intake/Output Summary (Last 24 hours) at 02/10/15 1629 Last data filed at 02/10/15 1300  Gross per 24 hour  Intake    900 ml  Output   1900 ml  Net  -1000 ml    Exam Awake Alert, Oriented *3, No new F.N deficits, Normal affect [place].AT,PERRAL Supple Neck,No JVD, No cervical lymphadenopathy appriciated.  Symmetrical Chest wall movement, Good air movement bilaterally, CTAB RRR,No Gallops,Rubs or new Murmurs, No Parasternal Heave +ve B.Sounds, Abd Soft, Non tender, No organomegaly appriciated, No rebound -guarding or rigidity. No Cyanosis, Clubbing or edema, No new Rash or bruise  Data Review   Cultures -  CBC w Diff: Lab Results  Component Value Date   WBC 6.8 02/10/2015   HGB 12.0* 02/10/2015   HCT 35.7* 02/10/2015   PLT 162 02/10/2015   LYMPHOPCT 7* 02/06/2015   MONOPCT 8 02/06/2015   EOSPCT 0 02/06/2015   BASOPCT 0 02/06/2015   CMP: Lab Results  Component Value Date   NA 139 02/10/2015   K 3.3* 02/10/2015   CL 106 02/10/2015   CO2 26 02/10/2015   BUN <5* 02/10/2015  CREATININE 0.80 02/10/2015   PROT 6.3* 02/10/2015   ALBUMIN 2.8* 02/10/2015   BILITOT 1.2 02/10/2015   ALKPHOS 69 02/10/2015   AST 67* 02/10/2015   ALT 41 02/10/2015  .  Micro Results Recent Results (from the past 240 hour(s))  Culture, blood (routine x 2)     Status: None (Preliminary result)   Collection Time: 02/09/15  8:23 AM  Result Value Ref Range Status   Specimen Description BLOOD RIGHT HAND  Final   Special Requests   Final    BOTTLES DRAWN AEROBIC AND ANAEROBIC 10CC BOTH BOTTLES   Culture   Final           BLOOD CULTURE  RECEIVED NO GROWTH TO DATE CULTURE WILL BE HELD FOR 5 DAYS BEFORE ISSUING A FINAL NEGATIVE REPORT [facility]formed [facility]    Report Status PENDING  Incomplete  Culture, blood (routine x 2)     Status: None (Preliminary result)   Collection Time: 02/09/15  8:29 AM  Result Value Ref Range Status   Specimen Description BLOOD LEFT HAND  Final   Special Requests BOTTLES DRAWN AEROBIC ONLY 10CC BLUE BOTTLE  Final   Culture   Final           BLOOD CULTURE RECEIVED NO GROWTH TO DATE CULTURE WILL BE HELD FOR 5 DAYS BEFORE ISSUING A FINAL NEGATIVE REPORT Performed at [facility]    Report Status PENDING  Incomplete     Discharge Instructions     Follow-up Information    Follow up with Jaclyn ShaggyEnobong, Amao, MD. Schedule an appointment as soon as possible for a visit in 1 week.   Specialty:  Family Medicine   Why:  posthospitalization follow-up for alcoholic pancreatitis   Contact information:   [address] [place] [address] [phone]       Discharge Medications     Medication List    STOP taking these medications        acetaminophen 500 MG tablet  Commonly known as:  TYLENOL      TAKE these medications        benztropine 0.5 MG tablet  Commonly known as:  COGENTIN  Take 1 tablet (0.5 mg total) by mouth 2 (two) times daily.     diphenhydrAMINE 50 MG capsule  Commonly known as:  BENADRYL  Take 1 capsule (50 mg total) by mouth at bedtime.     folic acid 1 MG tablet  Commonly known as:  FOLVITE  Take 1 tablet (1 mg total) by mouth daily.     haloperidol decanoate 50 MG/ML injection  Commonly known as:  HALDOL DECANOATE  Inject 50 mg into the muscle every 28 (twenty-eight) days.     multivitamin with minerals Tabs tablet  Take 1 tablet by mouth daily.     thiamine 100 MG tablet  Take 1 tablet (100 mg total) by mouth daily.         Total Time in preparing paper work, data evaluation and todays exam - 35 minutes  ELGERGAWY, DAWOOD M.D  on 02/10/2015 at 4:29 PM  [facility] Office  [phone]

## 2015-02-10 NOTE — Progress Notes (Signed)
Discharge instructions reviewed with patient utilizing teach back method. Patient discharged to home  

## 2015-02-10 NOTE — Discharge Instructions (Signed)
Follow with Primary MD Richard [name], Amao, MD in 7 days   Get CBC, CMP, 2 view Chest X ray checked  by Primary MD next visit.    Activity: As tolerated with Full fall precautions use walker/cane & assistance as needed   Disposition Home    Diet: Low fat , with feeding assistance and aspiration precautions.  For Heart failure patients - Check your Weight same time everyday, if you gain over 2 pounds, or you develop in leg swelling, experience more shortness of breath or chest pain, call your Primary MD immediately. Follow Cardiac Low Salt Diet and 1.5 lit/day fluid restriction.   On your next visit with your primary care physician please Get Medicines reviewed and adjusted.   Please request your Prim.MD to go over all Hospital Tests and Procedure/Radiological results at the follow up, please get all Hospital records sent to your Prim MD by signing hospital release before you go home.   If you experience worsening of your admission symptoms, develop shortness of breath, life threatening emergency, suicidal or homicidal thoughts you must seek medical attention immediately by calling 911 or calling your MD immediately  if symptoms less severe.  You Must read complete instructions/literature along with all the possible adverse reactions/side effects for all the Medicines you take and that have been prescribed to you. Take any new Medicines after you have completely understood and accpet all the possible adverse reactions/side effects.   Do not drive, operating heavy machinery, perform activities at heights, swimming or participation in water activities or provide baby sitting services if your were admitted for syncope or siezures until you have seen by Primary MD or a Neurologist and advised to do so again.  Do not drive when taking Pain medications.    Do not take more than prescribed Pain, Sleep and Anxiety Medications  Special Instructions: If you have smoked or chewed Tobacco  in the last  2 yrs please stop smoking, stop any regular Alcohol  and or any Recreational drug use.  Wear Seat belts while driving.   Please note  You were cared for by a hospitalist during your hospital stay. If you have any questions about your discharge medications or the care you received while you were in the hospital after you are discharged, you can call the unit and asked to speak with the hospitalist on call if the hospitalist that took care of you is not available. Once you are discharged, your primary care physician will handle any further medical issues. Please note that NO REFILLS for any discharge medications will be authorized once you are discharged, as it is imperative that you return to your primary care physician (or establish a relationship with a primary care physician if you do not have one) for your aftercare needs so that they can reassess your need for medications and monitor your lab values.

## 2015-02-15 LAB — CULTURE, BLOOD (ROUTINE X 2)
Culture: NO GROWTH
Culture: NO GROWTH

## 2015-08-05 IMAGING — CT CT ABD-PELV W/ CM
1 of 3 series · 14 of 32 positions shown, 19 images · IV contrast (OMNIPAQUE 300)
Comparison: 09/27/2014

CLINICAL DATA: Mid abdominal pain for 2 days. Possible history of
previous liver surgery.

EXAM:
CT ABDOMEN AND PELVIS WITH CONTRAST
TECHNIQUE: Multidetector CT imaging of the abdomen and pelvis was performed
using the standard protocol following bolus administration of
intravenous contrast.
CONTRAST:  100mL OMNIPAQUE IOHEXOL 300 MG/ML SOLN, 50mL OMNIPAQUE
IOHEXOL 300 MG/ML SOLN

[Series 2: abd/pel with · axial · 0.68mm/px · z∈[-486,-86]mm · 14 of 90 slices shown, 19 images]
[im 5/90  soft-tissue]
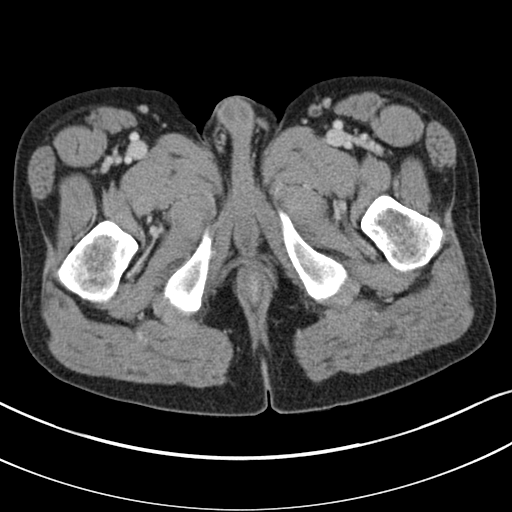
[im 5/90  bone]
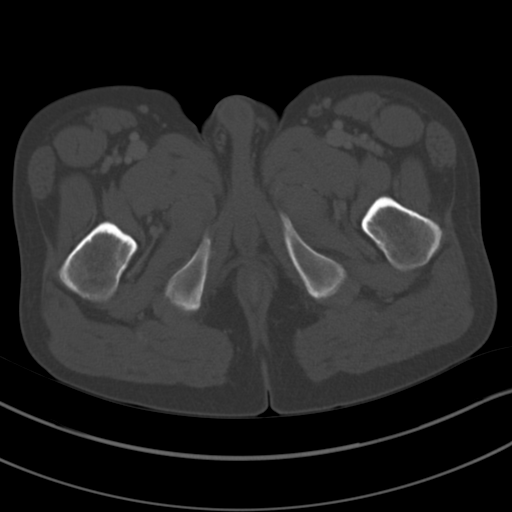
[im 15/90  soft-tissue]
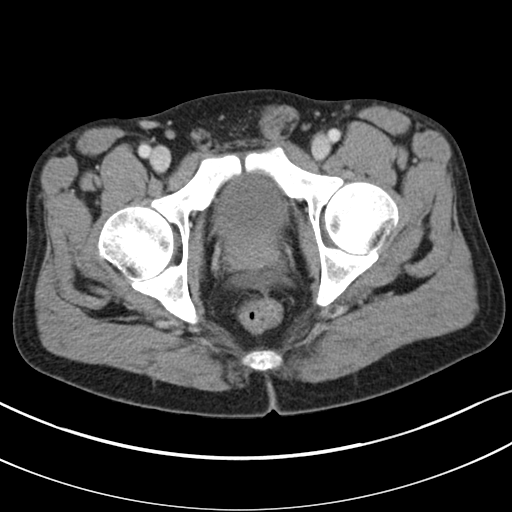
[im 19/90  soft-tissue]
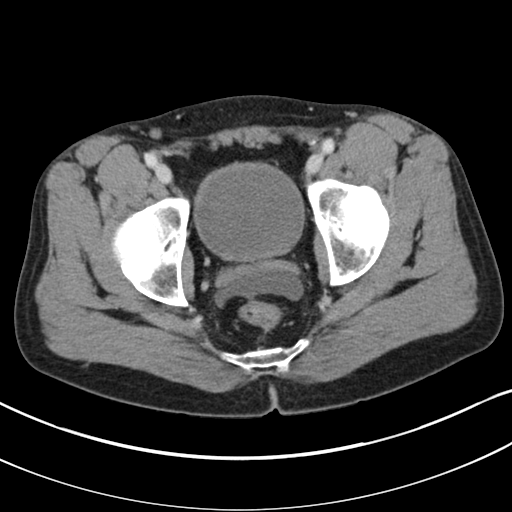
[im 24/90  soft-tissue]
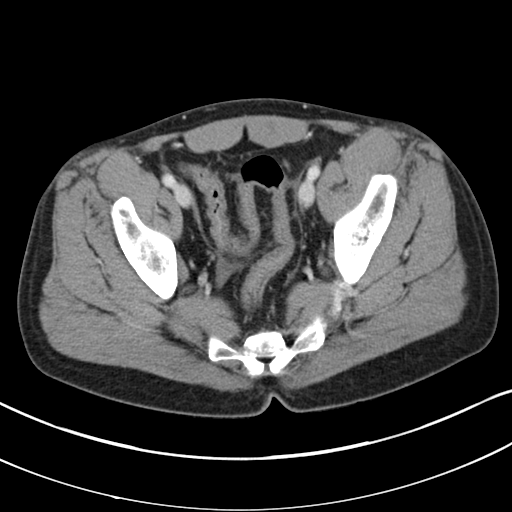
[im 33/90  soft-tissue]
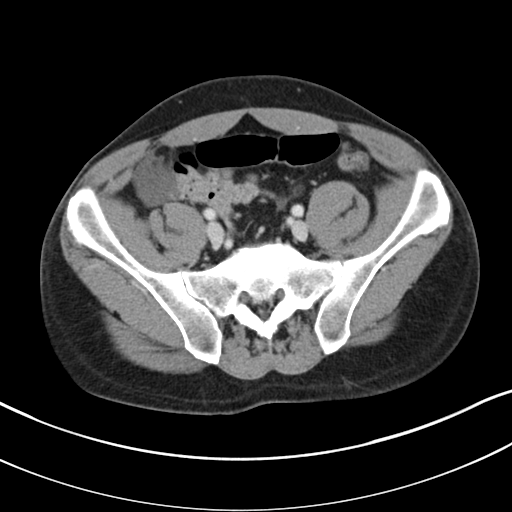
[im 38/90  soft-tissue]
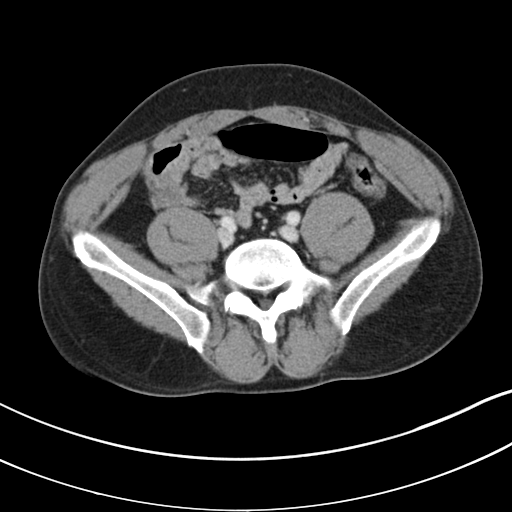
[im 47/90  soft-tissue]
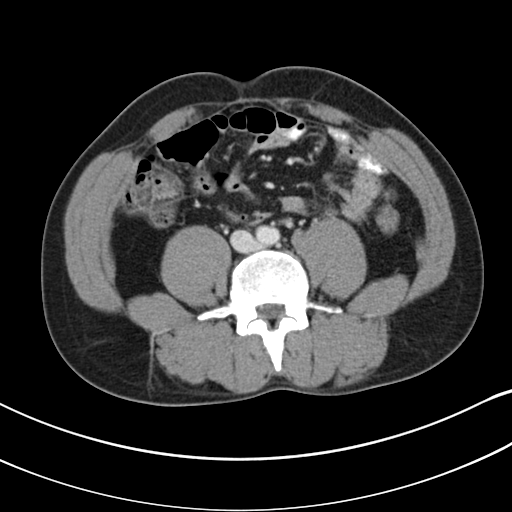
[im 52/90  soft-tissue]
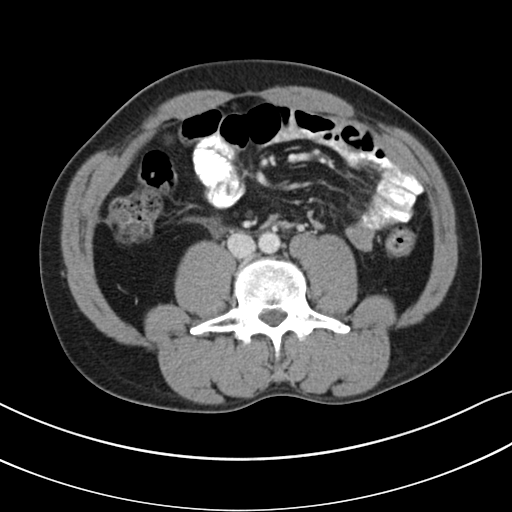
[im 57/90  soft-tissue]
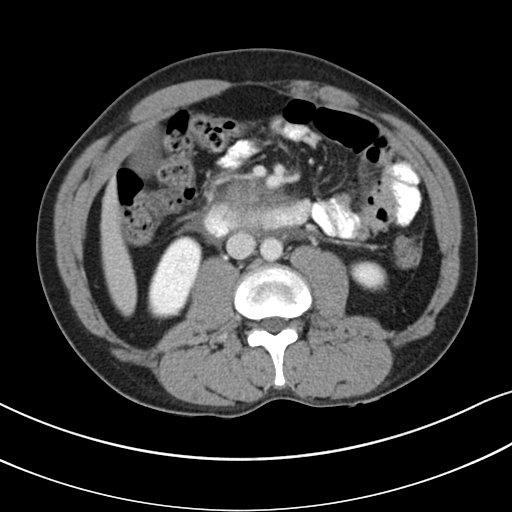
[im 57/90  bone]
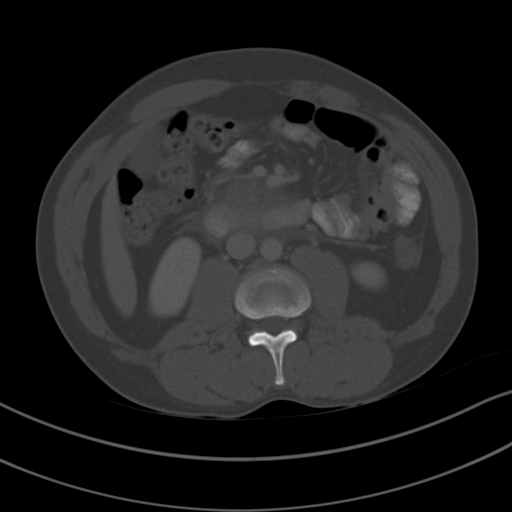
[im 66/90  soft-tissue]
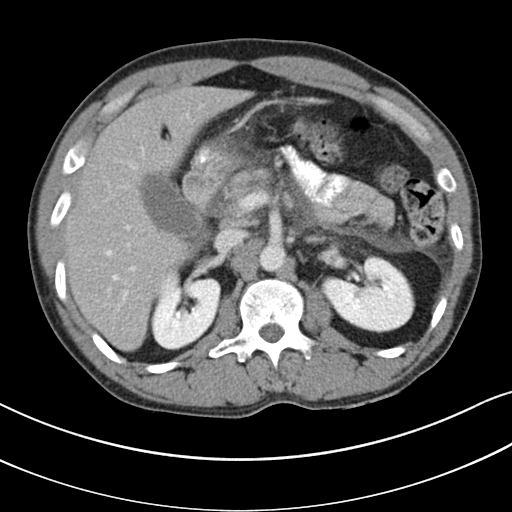
[im 71/90  soft-tissue]
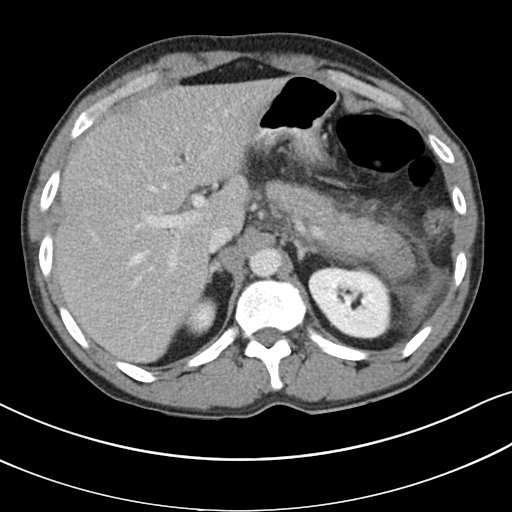
[im 71/90  lung]
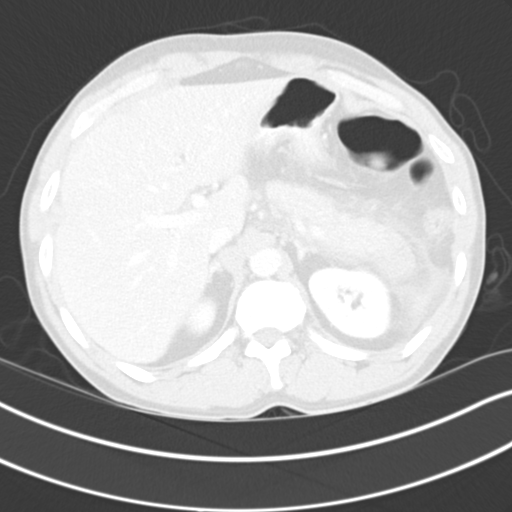
[im 75/90  soft-tissue]
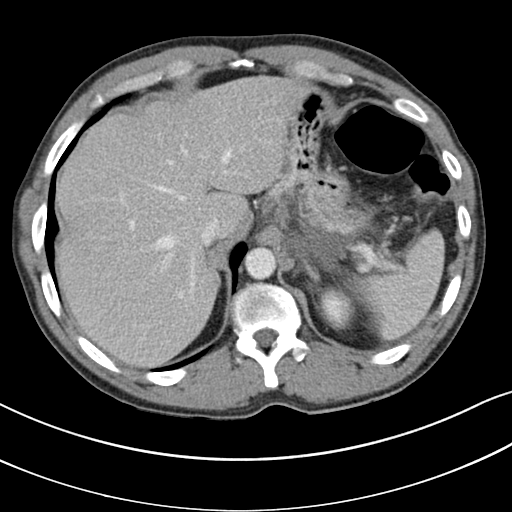
[im 75/90  lung]
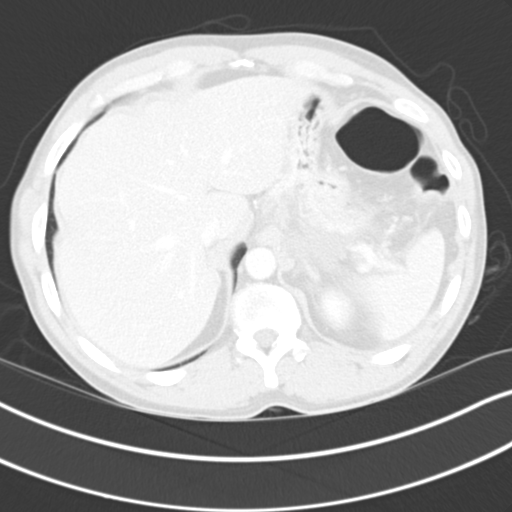
[im 80/90  lung]
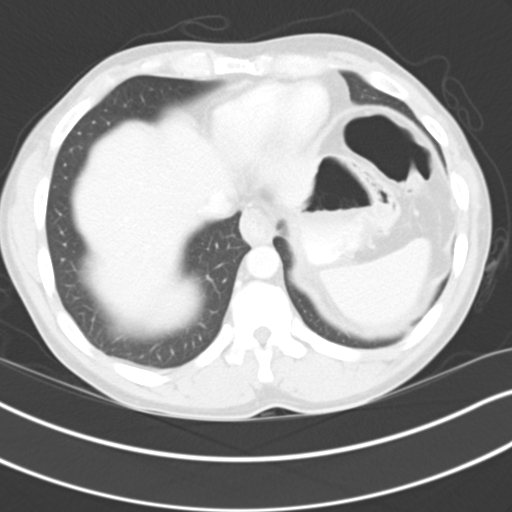
[im 85/90  soft-tissue]
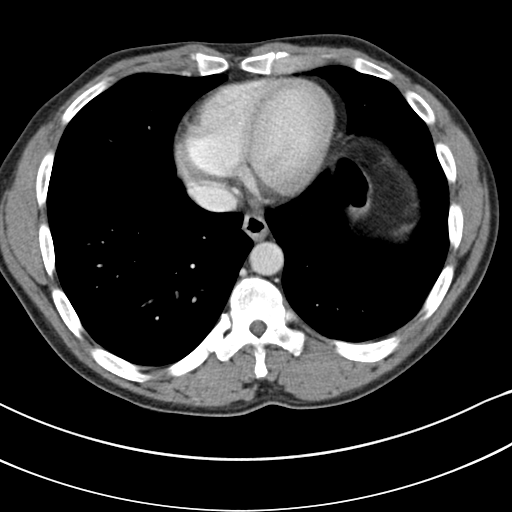
[im 85/90  lung]
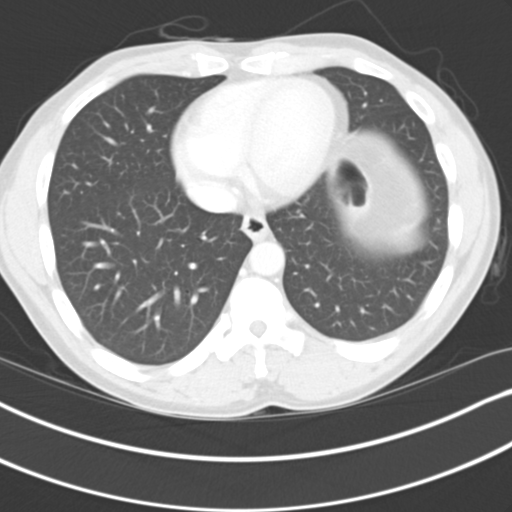

[14 of 32 positions shown; findings below may reference images not displayed]

FINDINGS: There are inflammatory changes surrounding the pancreas. Pancreatic
margins are ill-defined. Pancreas shows homogeneous enhancement. No
pancreatic mass. Fluid attenuation extends from the pancreas along
the anterior para renal fascia bilaterally. A small amount of fluid
collects in the posterior pelvic recess and right lower quadrant.

There is normal enhancement of the splenic vein, portal vein and
superior mesenteric vein.

There is fatty infiltration of the liver. No liver mass or focal
lesion.

Gallbladder is unremarkable.  No bile duct dilation.

Normal spleen. No adrenal masses. Normal kidneys, ureters and
bladder.

No adenopathy.

Normal bowel.  Normal appendix.  No significant bony abnormality.

Clear lung bases.
IMPRESSION: 1. Acute uncomplicated pancreatitis. No evidence of pancreatic
necrosis, venous thrombosis or pseudocyst or abscess formation.
2. No other acute findings.
3. Hepatic steatosis.

## 2015-08-09 IMAGING — CR DG ABDOMEN 1V
1 series · 2 of 2 positions shown · non-contrast
Comparison: 02/09/2015

CLINICAL DATA: Nausea and vomiting. Acute pancreatitis. Chronic
hepatitis-B.

EXAM:
ABDOMEN - 1 VIEW

[Series 1: ap (kub) · U · 2 of 2 slices shown]
[im 1/2]
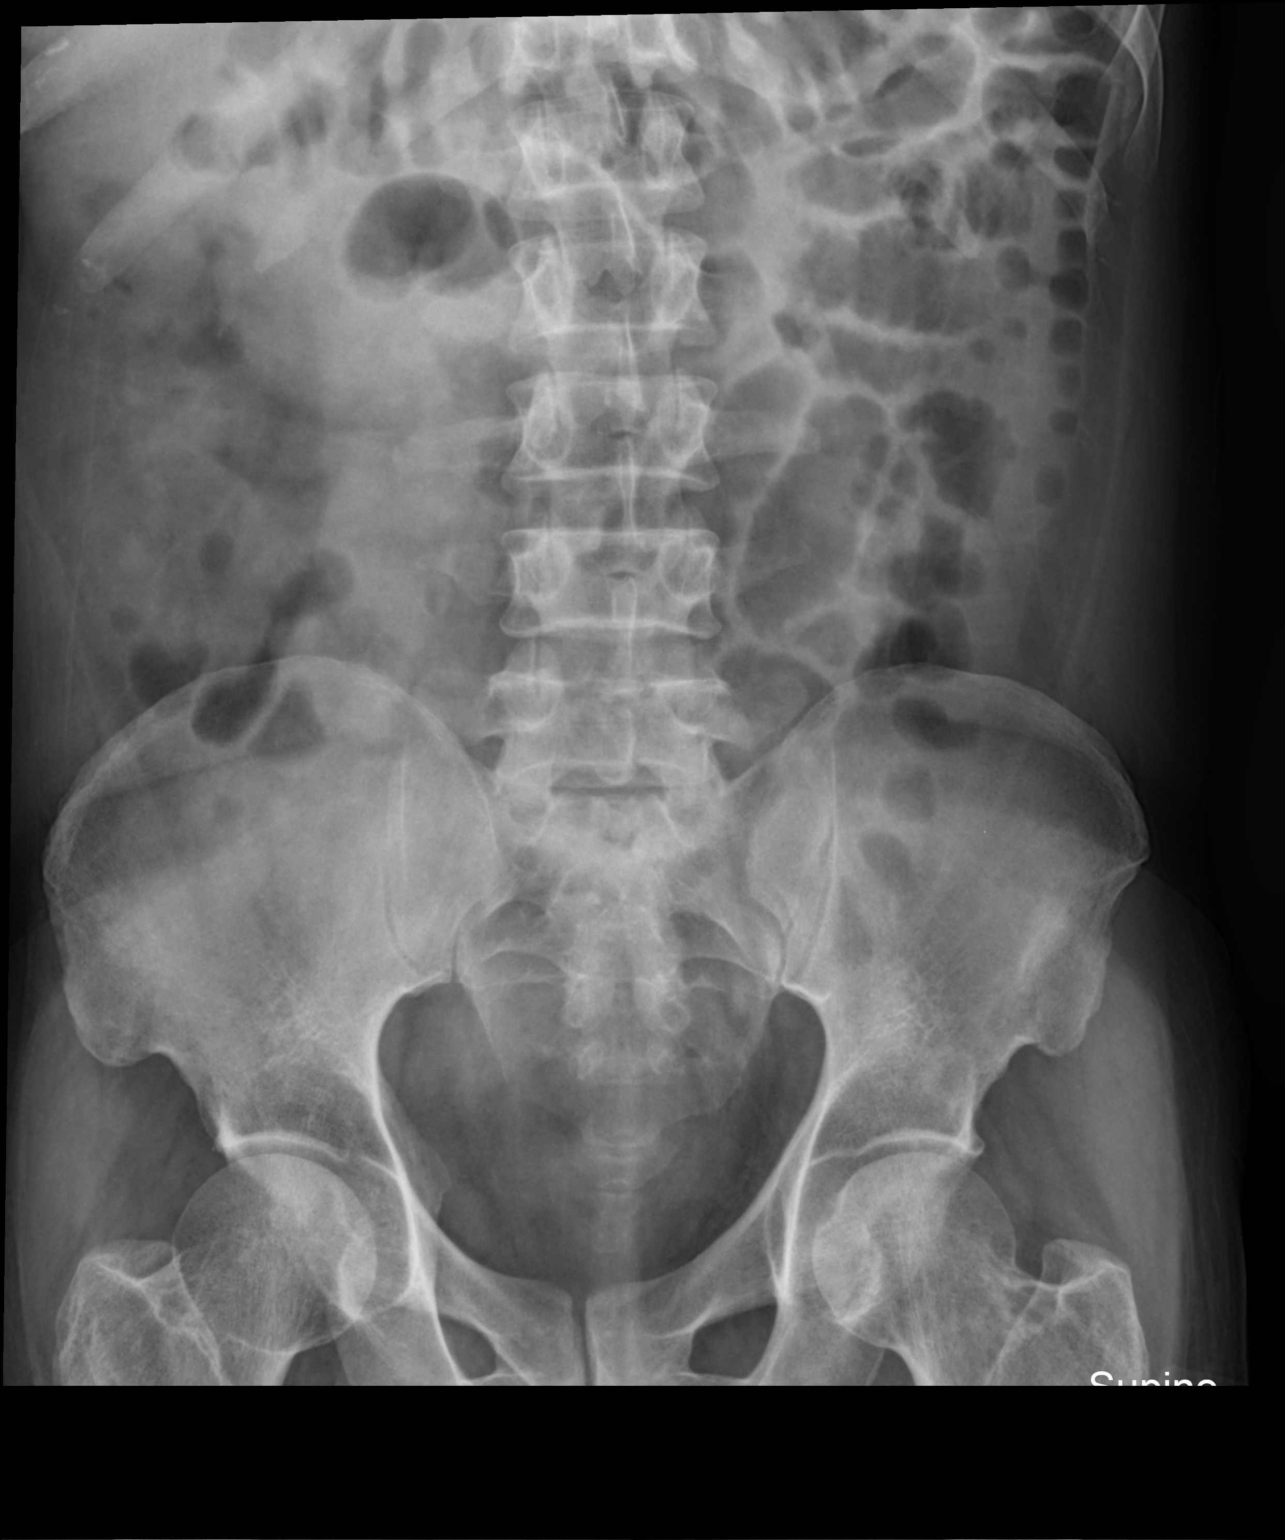
[im 2/2]
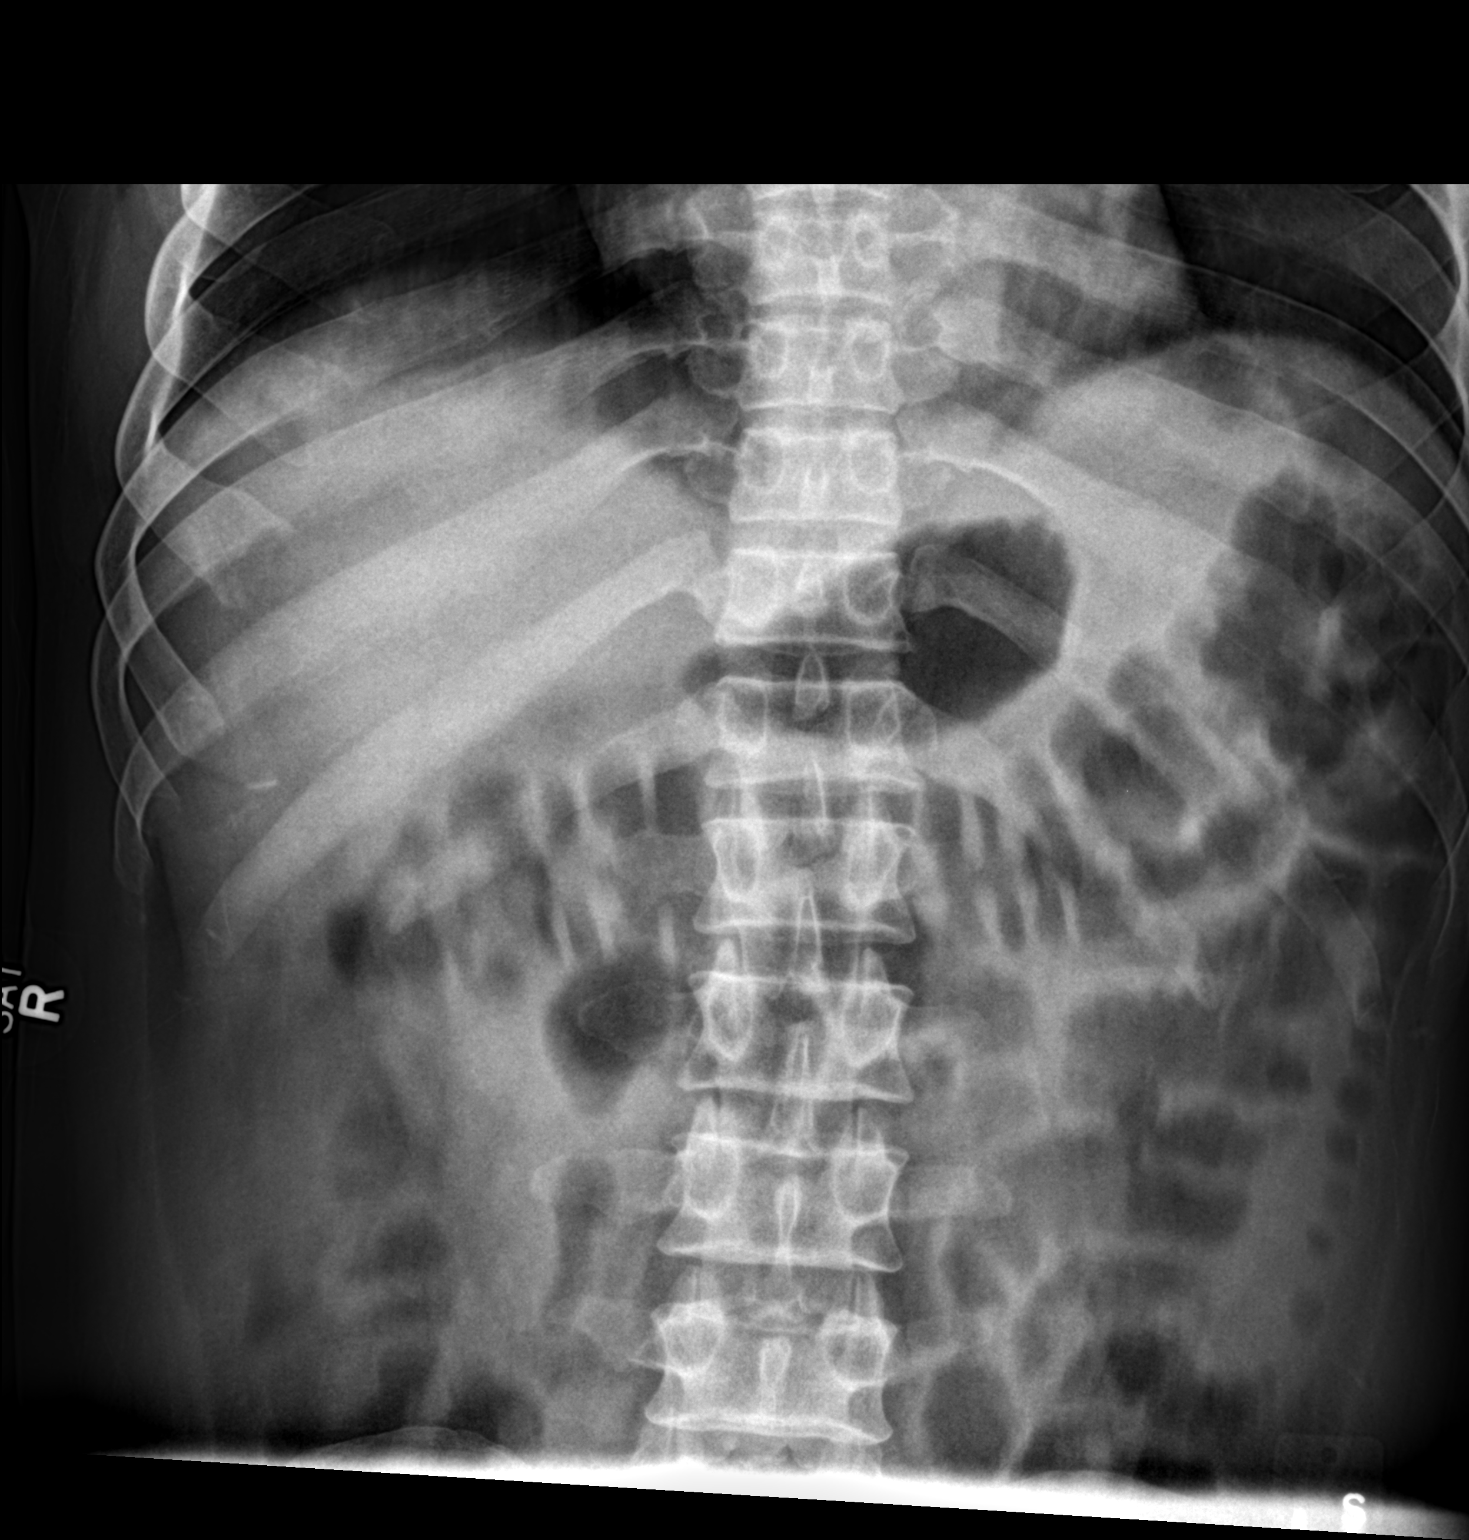

[2 of 2 positions shown; findings below may reference images not displayed]

FINDINGS: Mild gaseous distention of both small bowel and colon is seen, which
is not significant changed compared to previous study. This is
suspicious for a mild ileus. No radiopaque calculi or other
significant radiographic abnormality identified.
IMPRESSION: Mild ileus pattern, without significant interval change.
